# Patient Record
Sex: Male | Born: 1975 | Race: Black or African American | Hispanic: No | Marital: Married | Smoking: Never smoker
Health system: Southern US, Community
[De-identification: ages and names within clinical notes are randomized; demographics above are authoritative.]

## PROBLEM LIST (undated history)

## (undated) DIAGNOSIS — K5792 Diverticulitis of intestine, part unspecified, without perforation or abscess without bleeding: Secondary | ICD-10-CM

## (undated) HISTORY — PX: FEMUR SURGERY: SHX943

## (undated) HISTORY — PX: HIP SURGERY: SHX245

## (undated) HISTORY — PX: KNEE SURGERY: SHX244

---

## 2001-08-22 ENCOUNTER — Emergency Department (HOSPITAL_COMMUNITY): Admission: EM | Admit: 2001-08-22 | Discharge: 2001-08-22 | Payer: Self-pay | Admitting: Emergency Medicine

## 2001-08-22 ENCOUNTER — Encounter: Payer: Self-pay | Admitting: Emergency Medicine

## 2004-05-26 ENCOUNTER — Encounter (INDEPENDENT_AMBULATORY_CARE_PROVIDER_SITE_OTHER): Payer: Self-pay | Admitting: *Deleted

## 2004-05-26 ENCOUNTER — Observation Stay (HOSPITAL_COMMUNITY): Admission: EM | Admit: 2004-05-26 | Discharge: 2004-05-27 | Payer: Self-pay | Admitting: Family Medicine

## 2004-10-28 ENCOUNTER — Emergency Department (HOSPITAL_COMMUNITY): Admission: EM | Admit: 2004-10-28 | Discharge: 2004-10-29 | Payer: Self-pay | Admitting: Emergency Medicine

## 2004-11-05 ENCOUNTER — Encounter: Admission: RE | Admit: 2004-11-05 | Discharge: 2004-11-05 | Payer: Self-pay | Admitting: Otolaryngology

## 2005-08-18 ENCOUNTER — Emergency Department (HOSPITAL_COMMUNITY): Admission: EM | Admit: 2005-08-18 | Discharge: 2005-08-18 | Payer: Self-pay | Admitting: Emergency Medicine

## 2005-08-25 ENCOUNTER — Emergency Department (HOSPITAL_COMMUNITY): Admission: EM | Admit: 2005-08-25 | Discharge: 2005-08-26 | Payer: Self-pay | Admitting: Emergency Medicine

## 2006-07-20 ENCOUNTER — Emergency Department (HOSPITAL_COMMUNITY): Admission: EM | Admit: 2006-07-20 | Discharge: 2006-07-20 | Payer: Self-pay | Admitting: Emergency Medicine

## 2006-07-23 ENCOUNTER — Emergency Department (HOSPITAL_COMMUNITY): Admission: EM | Admit: 2006-07-23 | Discharge: 2006-07-23 | Payer: Self-pay | Admitting: Family Medicine

## 2009-01-12 ENCOUNTER — Ambulatory Visit: Payer: Self-pay | Admitting: Pulmonary Disease

## 2009-01-12 DIAGNOSIS — F518 Other sleep disorders not due to a substance or known physiological condition: Secondary | ICD-10-CM | POA: Insufficient documentation

## 2009-01-12 DIAGNOSIS — G4733 Obstructive sleep apnea (adult) (pediatric): Secondary | ICD-10-CM | POA: Insufficient documentation

## 2009-02-09 ENCOUNTER — Ambulatory Visit (HOSPITAL_BASED_OUTPATIENT_CLINIC_OR_DEPARTMENT_OTHER): Admission: RE | Admit: 2009-02-09 | Discharge: 2009-02-09 | Payer: Self-pay | Admitting: Pulmonary Disease

## 2009-02-09 ENCOUNTER — Encounter: Payer: Self-pay | Admitting: Pulmonary Disease

## 2009-02-11 ENCOUNTER — Ambulatory Visit: Payer: Self-pay | Admitting: Pulmonary Disease

## 2009-02-14 ENCOUNTER — Telehealth (INDEPENDENT_AMBULATORY_CARE_PROVIDER_SITE_OTHER): Payer: Self-pay | Admitting: *Deleted

## 2009-02-23 ENCOUNTER — Ambulatory Visit: Payer: Self-pay | Admitting: Pulmonary Disease

## 2009-10-18 ENCOUNTER — Inpatient Hospital Stay (HOSPITAL_COMMUNITY): Admission: EM | Admit: 2009-10-18 | Discharge: 2009-11-02 | Payer: Self-pay | Admitting: Emergency Medicine

## 2009-10-19 DIAGNOSIS — Z9189 Other specified personal risk factors, not elsewhere classified: Secondary | ICD-10-CM | POA: Insufficient documentation

## 2009-10-19 DIAGNOSIS — D696 Thrombocytopenia, unspecified: Secondary | ICD-10-CM

## 2009-10-19 DIAGNOSIS — S27329A Contusion of lung, unspecified, initial encounter: Secondary | ICD-10-CM | POA: Insufficient documentation

## 2009-10-19 DIAGNOSIS — D62 Acute posthemorrhagic anemia: Secondary | ICD-10-CM

## 2009-10-19 DIAGNOSIS — S72309B Unspecified fracture of shaft of unspecified femur, initial encounter for open fracture type I or II: Secondary | ICD-10-CM

## 2009-10-19 DIAGNOSIS — J9600 Acute respiratory failure, unspecified whether with hypoxia or hypercapnia: Secondary | ICD-10-CM

## 2009-10-19 DIAGNOSIS — S36115A Moderate laceration of liver, initial encounter: Secondary | ICD-10-CM | POA: Insufficient documentation

## 2009-11-20 ENCOUNTER — Inpatient Hospital Stay (HOSPITAL_COMMUNITY): Admission: AD | Admit: 2009-11-20 | Discharge: 2009-11-24 | Payer: Self-pay | Admitting: Orthopedic Surgery

## 2009-11-20 ENCOUNTER — Encounter: Payer: Self-pay | Admitting: Internal Medicine

## 2009-11-20 DIAGNOSIS — T8140XA Infection following a procedure, unspecified, initial encounter: Secondary | ICD-10-CM

## 2009-11-22 ENCOUNTER — Ambulatory Visit: Payer: Self-pay | Admitting: Internal Medicine

## 2009-11-26 ENCOUNTER — Encounter: Payer: Self-pay | Admitting: Internal Medicine

## 2009-11-30 ENCOUNTER — Encounter (INDEPENDENT_AMBULATORY_CARE_PROVIDER_SITE_OTHER): Payer: Self-pay | Admitting: *Deleted

## 2009-12-03 ENCOUNTER — Encounter: Payer: Self-pay | Admitting: Internal Medicine

## 2009-12-06 ENCOUNTER — Ambulatory Visit: Payer: Self-pay | Admitting: Internal Medicine

## 2009-12-07 ENCOUNTER — Encounter: Payer: Self-pay | Admitting: Internal Medicine

## 2009-12-19 ENCOUNTER — Encounter: Payer: Self-pay | Admitting: Internal Medicine

## 2009-12-20 ENCOUNTER — Ambulatory Visit: Payer: Self-pay | Admitting: Internal Medicine

## 2009-12-28 ENCOUNTER — Encounter: Payer: Self-pay | Admitting: Internal Medicine

## 2010-01-02 ENCOUNTER — Encounter: Payer: Self-pay | Admitting: Internal Medicine

## 2010-01-08 ENCOUNTER — Encounter
Admission: RE | Admit: 2010-01-08 | Discharge: 2010-03-12 | Payer: Self-pay | Source: Home / Self Care | Admitting: Orthopedic Surgery

## 2010-04-26 ENCOUNTER — Ambulatory Visit (HOSPITAL_COMMUNITY)
Admission: RE | Admit: 2010-04-26 | Discharge: 2010-04-26 | Payer: Self-pay | Source: Home / Self Care | Attending: Orthopedic Surgery | Admitting: Orthopedic Surgery

## 2010-04-29 LAB — BASIC METABOLIC PANEL
BUN: 13 mg/dL (ref 6–23)
GFR calc Af Amer: >60 mL/min (ref >60)
GFR calc non Af Amer: >60 mL/min (ref >60)
Glucose, Bld: 79 mg/dL (ref 70–99)
Potassium: 5 mEq/L (ref 3.5–5.1)

## 2010-04-29 LAB — CBC
HCT: 42 % (ref 39.0–52.0)
MCV: 91.7 fL (ref 78.0–100.0)
Platelets: 230 10*3/uL (ref 150–400)
RBC: 4.58 MIL/uL (ref 4.22–5.81)
RDW: 14.8 % (ref 11.5–15.5)

## 2010-04-29 LAB — DIFFERENTIAL
Basophils Absolute: 0 10*3/uL (ref 0.0–0.1)
Basophils Relative: 1 % (ref 0–1)
Lymphs Abs: 2.9 10*3/uL (ref 0.7–4.0)
Neutro Abs: 1.7 10*3/uL (ref 1.7–7.7)

## 2010-04-29 LAB — TYPE AND SCREEN
ABO/RH(D): AB POS
Antibody Screen: NEGATIVE

## 2010-04-29 LAB — PROTIME-INR
INR: 1.01 (ref 0.00–1.49)
Prothrombin Time: 13.5 seconds (ref 11.6–15.2)

## 2010-04-29 LAB — APTT: aPTT: 29 seconds (ref 24–37)

## 2010-04-29 LAB — SURGICAL PCR SCREEN
MRSA, PCR: NEGATIVE
Staphylococcus aureus: NEGATIVE

## 2010-04-29 LAB — C-REACTIVE PROTEIN: CRP: 0.3 mg/dL — ABNORMAL LOW (ref <0.6)

## 2010-04-29 LAB — SEDIMENTATION RATE: Sed Rate: 2 mm/hr (ref 0–16)

## 2010-05-07 NOTE — Consult Note (Signed)
Summary: [facility] Tra[facility] Specialist  Ortho. Trauma Specialist   Imported By: Florinda Marker 01/23/2010 09:01:12  _____________________________________________________________________  External Attachment:    Type:   Image     Comment:   External Document

## 2010-05-07 NOTE — Miscellaneous (Signed)
Summary: HIPAA Restrictions  HIPAA Restrictions   Imported By: Florinda Marker 12/07/2009 15:31:48  _____________________________________________________________________  External Attachment:    Type:   Image     Comment:   External Document

## 2010-05-07 NOTE — Assessment & Plan Note (Signed)
Summary: 2wk f/u - difficulty falling and staying asleep   Referring Provider:  Self Referral Primary Provider:  None  CC:  follow-up visit, difficulty falling asleep and staying asleep, and mind won't let him go to sleep.[name]  History of Present Illness: Mr. Lukins has now completed 30 days of antibiotic therapy for his MRSA abdominal wound infection.  All of the stitches are out and he's had no wound drainage or residual pain.  He's had no problems tolerating his doxycycline.  He is not sleeping well but he feels rested during the day and is now back working out in the gym.  Preventive Screening-Counseling & Management  Alcohol-Tobacco     Alcohol drinks/day: 0     Smoking Status: never  Caffeine-Diet-Exercise     Caffeine use/day: no     Does Patient Exercise: yes, not right now     Type of exercise: football  Safety-Violence-Falls     Seat Belt Use: no   Current Allergies (reviewed today): No known allergies  Vital Signs:  Patient profile:   35 year old male Height:      72 inches (182.88 cm) Weight:      211.8 pounds (96.27 kg) BMI:     28.83 Temp:     98.4 degrees F (36.89 degrees C) oral Pulse rate:   81 / minute BP sitting:   123 / 85  (left arm) Cuff size:   large  Vitals Entered By: Jennet Maduro RN (December 20, 2009 2:57 PM) CC: follow-up visit, difficulty falling asleep and staying asleep, mind won't let him go to sleep. Is Patient Diabetic? No Pain Assessment Patient in pain? no      Nutritional Status BMI of 25 - 29 = overweight Nutritional Status Detail appetite "getting back to his regular appetite"  Have you ever been in a relationship where you felt threatened, hurt or afraid?not asked    Does patient need assistance? Functional Status Self care Ambulation Impaired:Risk for fall Comments using one crutch for stability   Physical Exam  General:  alert and well-nourished.   Abdomen:  is low transverse incision is healing nicely without any  signs of active infection. Extremities:  his previous right arm PICC site is well healed.   Impression & Recommendations:  Problem # 1:  OTHER POSTOPERATIVE INFECTION NEC (ICD-998.59) I suspect his MRSA infection is cured.  He will stop the doxycycline today.  He knows to call me if he has any signs or symptoms of recurrent infection. Orders: Est. Patient Level III (16109)  Patient Instructions: 1)  Please schedule a follow-up appointment as needed.
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
## 2010-05-07 NOTE — Consult Note (Signed)
Summary: [facility]   Imported By: Florinda Marker 12/31/2009 15:56:37  _____________________________________________________________________  External Attachment:    Type:   Image     Comment:   External Document

## 2010-05-07 NOTE — Miscellaneous (Signed)
Summary: [employer]  [employer]   Imported By: [name] 12/31/2009 11:07:12  _____________________________________________________________________  External Attachment:    Type:   Image     Comment:   External Document
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
## 2010-06-12 ENCOUNTER — Encounter: Payer: Self-pay | Admitting: *Deleted

## 2010-06-21 LAB — HEPATIC FUNCTION PANEL
ALT: 15 U/L (ref 0–53)
AST: 14 U/L (ref 0–37)
AST: 21 U/L (ref 0–37)
Alkaline Phosphatase: 95 U/L (ref 39–117)
Bilirubin, Direct: 0.2 mg/dL (ref 0.0–0.3)
Bilirubin, Direct: 0.2 mg/dL (ref 0.0–0.3)
Indirect Bilirubin: 0.3 mg/dL (ref 0.3–0.9)
Total Bilirubin: 0.9 mg/dL (ref 0.3–1.2)

## 2010-06-21 LAB — CBC
HCT: 30.7 % — ABNORMAL LOW (ref 39.0–52.0)
HCT: 34.2 % — ABNORMAL LOW (ref 39.0–52.0)
Hemoglobin: 10.1 g/dL — ABNORMAL LOW (ref 13.0–17.0)
MCH: 29.9 pg (ref 26.0–34.0)
MCH: 30.4 pg (ref 26.0–34.0)
MCHC: 33 g/dL (ref 30.0–36.0)
MCHC: 33 g/dL (ref 30.0–36.0)
MCHC: 33.6 g/dL (ref 30.0–36.0)
MCV: 90.5 fL (ref 78.0–100.0)
MCV: 90.6 fL (ref 78.0–100.0)
Platelets: 322 10*3/uL (ref 150–400)
Platelets: 356 10*3/uL (ref 150–400)
RBC: 3.39 MIL/uL — ABNORMAL LOW (ref 4.22–5.81)
RDW: 14.9 % (ref 11.5–15.5)
RDW: 15.2 % (ref 11.5–15.5)
WBC: 14 10*3/uL — ABNORMAL HIGH (ref 4.0–10.5)
WBC: 5.6 10*3/uL (ref 4.0–10.5)
WBC: 8.9 10*3/uL (ref 4.0–10.5)

## 2010-06-21 LAB — DIFFERENTIAL
Lymphocytes Relative: 19 % (ref 12–46)
Neutrophils Relative %: 69 % (ref 43–77)

## 2010-06-21 LAB — COMPREHENSIVE METABOLIC PANEL
AST: 19 U/L (ref 0–37)
BUN: 11 mg/dL (ref 6–23)
Creatinine, Ser: 1.05 mg/dL (ref 0.4–1.5)
GFR calc Af Amer: >60 mL/min (ref >60)
Glucose, Bld: 97 mg/dL (ref 70–99)
Potassium: 5 mEq/L (ref 3.5–5.1)
Total Protein: 8.1 g/dL (ref 6.0–8.3)

## 2010-06-21 LAB — ANAEROBIC CULTURE

## 2010-06-21 LAB — APTT MIXING STUDY SCREEN: aPTT Baseline: 31 seconds (ref 24–37)

## 2010-06-21 LAB — CULTURE, ROUTINE-ABSCESS

## 2010-06-21 LAB — BASIC METABOLIC PANEL
BUN: 3 mg/dL — ABNORMAL LOW (ref 6–23)
CO2: 30 mEq/L (ref 19–32)
Calcium: 9.2 mg/dL (ref 8.4–10.5)
Chloride: 102 mEq/L (ref 96–112)
Chloride: 99 mEq/L (ref 96–112)
Creatinine, Ser: 0.79 mg/dL (ref 0.4–1.5)
Creatinine, Ser: 1.11 mg/dL (ref 0.4–1.5)
GFR calc Af Amer: >60 mL/min (ref >60)
Glucose, Bld: 118 mg/dL — ABNORMAL HIGH (ref 70–99)
Potassium: 4.8 mEq/L (ref 3.5–5.1)

## 2010-06-21 LAB — TYPE AND SCREEN: ABO/RH(D): AB POS

## 2010-06-21 LAB — VANCOMYCIN, RANDOM: Vancomycin Rm: 14.6 ug/mL

## 2010-06-21 LAB — PROTIME-INR: Prothrombin Time: 15.2 seconds (ref 11.6–15.2)

## 2010-06-21 LAB — GRAM STAIN

## 2010-06-21 LAB — SEDIMENTATION RATE: Sed Rate: 60 mm/hr — ABNORMAL HIGH (ref 0–16)

## 2010-06-22 LAB — CATH TIP CULTURE: Culture: 3

## 2010-06-22 LAB — POCT I-STAT 3, ART BLOOD GAS (G3+)
Acid-Base Excess: 1 mmol/L (ref 0.0–2.0)
Acid-Base Excess: 1 mmol/L (ref 0.0–2.0)
Acid-Base Excess: 2 mmol/L (ref 0.0–2.0)
Acid-Base Excess: 2 mmol/L (ref 0.0–2.0)
Acid-Base Excess: 2 mmol/L (ref 0.0–2.0)
Acid-base deficit: 3 mmol/L — ABNORMAL HIGH (ref 0.0–2.0)
Bicarbonate: 25.7 mEq/L — ABNORMAL HIGH (ref 20.0–24.0)
Bicarbonate: 26.6 mEq/L — ABNORMAL HIGH (ref 20.0–24.0)
Bicarbonate: 26.7 mEq/L — ABNORMAL HIGH (ref 20.0–24.0)
Bicarbonate: 26.7 mEq/L — ABNORMAL HIGH (ref 20.0–24.0)
Bicarbonate: 27.1 mEq/L — ABNORMAL HIGH (ref 20.0–24.0)
Bicarbonate: 27.3 mEq/L — ABNORMAL HIGH (ref 20.0–24.0)
O2 Saturation: 100 %
O2 Saturation: 90 %
O2 Saturation: 91 %
O2 Saturation: 94 %
O2 Saturation: 94 %
O2 Saturation: 96 %
Patient temperature: 100
Patient temperature: 100.6
Patient temperature: 100.8
Patient temperature: 102.4
Patient temperature: 102.9
Patient temperature: 98.7
TCO2: 25 mmol/L (ref 0–100)
TCO2: 28 mmol/L (ref 0–100)
TCO2: 28 mmol/L (ref 0–100)
TCO2: 28 mmol/L (ref 0–100)
pCO2 arterial: 41 mmHg (ref 35.0–45.0)
pCO2 arterial: 42.3 mmHg (ref 35.0–45.0)
pCO2 arterial: 43.5 mmHg (ref 35.0–45.0)
pCO2 arterial: 45.6 mmHg — ABNORMAL HIGH (ref 35.0–45.0)
pCO2 arterial: 46.8 mmHg — ABNORMAL HIGH (ref 35.0–45.0)
pH, Arterial: 7.377 (ref 7.350–7.450)
pH, Arterial: 7.385 (ref 7.350–7.450)
pH, Arterial: 7.404 (ref 7.350–7.450)
pH, Arterial: 7.408 (ref 7.350–7.450)
pH, Arterial: 7.421 (ref 7.350–7.450)
pO2, Arterial: 109 mmHg — ABNORMAL HIGH (ref 80.0–100.0)
pO2, Arterial: 325 mmHg — ABNORMAL HIGH (ref 80.0–100.0)
pO2, Arterial: 53 mmHg — ABNORMAL LOW (ref 80.0–100.0)
pO2, Arterial: 66 mmHg — ABNORMAL LOW (ref 80.0–100.0)
pO2, Arterial: 67 mmHg — ABNORMAL LOW (ref 80.0–100.0)
pO2, Arterial: 72 mmHg — ABNORMAL LOW (ref 80.0–100.0)
pO2, Arterial: 80 mmHg (ref 80.0–100.0)
pO2, Arterial: 84 mmHg (ref 80.0–100.0)
pO2, Arterial: 96 mmHg (ref 80.0–100.0)

## 2010-06-22 LAB — CBC
HCT: 22.7 % — ABNORMAL LOW (ref 39.0–52.0)
HCT: 22.9 % — ABNORMAL LOW (ref 39.0–52.0)
HCT: 23.4 % — ABNORMAL LOW (ref 39.0–52.0)
HCT: 23.7 % — ABNORMAL LOW (ref 39.0–52.0)
HCT: 24.1 % — ABNORMAL LOW (ref 39.0–52.0)
HCT: 25.3 % — ABNORMAL LOW (ref 39.0–52.0)
HCT: 29.5 % — ABNORMAL LOW (ref 39.0–52.0)
HCT: 29.9 % — ABNORMAL LOW (ref 39.0–52.0)
HCT: 31.8 % — ABNORMAL LOW (ref 39.0–52.0)
Hemoglobin: 10.7 g/dL — ABNORMAL LOW (ref 13.0–17.0)
Hemoglobin: 7.8 g/dL — ABNORMAL LOW (ref 13.0–17.0)
Hemoglobin: 7.8 g/dL — ABNORMAL LOW (ref 13.0–17.0)
Hemoglobin: 7.9 g/dL — ABNORMAL LOW (ref 13.0–17.0)
Hemoglobin: 7.9 g/dL — ABNORMAL LOW (ref 13.0–17.0)
Hemoglobin: 8.1 g/dL — ABNORMAL LOW (ref 13.0–17.0)
Hemoglobin: 8.2 g/dL — ABNORMAL LOW (ref 13.0–17.0)
Hemoglobin: 8.6 g/dL — ABNORMAL LOW (ref 13.0–17.0)
MCH: 30.3 pg (ref 26.0–34.0)
MCH: 30.5 pg (ref 26.0–34.0)
MCH: 30.8 pg (ref 26.0–34.0)
MCH: 31 pg (ref 26.0–34.0)
MCH: 31 pg (ref 26.0–34.0)
MCH: 31.1 pg (ref 26.0–34.0)
MCH: 31.1 pg (ref 26.0–34.0)
MCH: 31.2 pg (ref 26.0–34.0)
MCH: 31.2 pg (ref 26.0–34.0)
MCH: 31.4 pg (ref 26.0–34.0)
MCH: 31.5 pg (ref 26.0–34.0)
MCHC: 33.2 g/dL (ref 30.0–36.0)
MCHC: 33.6 g/dL (ref 30.0–36.0)
MCHC: 33.8 g/dL (ref 30.0–36.0)
MCHC: 33.9 g/dL (ref 30.0–36.0)
MCHC: 34.1 g/dL (ref 30.0–36.0)
MCHC: 34.1 g/dL (ref 30.0–36.0)
MCHC: 34.1 g/dL (ref 30.0–36.0)
MCHC: 34.3 g/dL (ref 30.0–36.0)
MCHC: 34.5 g/dL (ref 30.0–36.0)
MCV: 90.5 fL (ref 78.0–100.0)
MCV: 90.7 fL (ref 78.0–100.0)
MCV: 90.8 fL (ref 78.0–100.0)
MCV: 90.8 fL (ref 78.0–100.0)
MCV: 91.2 fL (ref 78.0–100.0)
MCV: 91.6 fL (ref 78.0–100.0)
Platelets: 122 10*3/uL — ABNORMAL LOW (ref 150–400)
Platelets: 178 10*3/uL (ref 150–400)
Platelets: 282 10*3/uL (ref 150–400)
Platelets: 300 10*3/uL (ref 150–400)
Platelets: 354 10*3/uL (ref 150–400)
Platelets: 55 10*3/uL — ABNORMAL LOW (ref 150–400)
Platelets: 57 10*3/uL — ABNORMAL LOW (ref 150–400)
Platelets: 61 10*3/uL — ABNORMAL LOW (ref 150–400)
Platelets: 61 10*3/uL — ABNORMAL LOW (ref 150–400)
Platelets: 633 10*3/uL — ABNORMAL HIGH (ref 150–400)
Platelets: 65 10*3/uL — ABNORMAL LOW (ref 150–400)
Platelets: 68 10*3/uL — ABNORMAL LOW (ref 150–400)
Platelets: 68 10*3/uL — ABNORMAL LOW (ref 150–400)
Platelets: 77 10*3/uL — ABNORMAL LOW (ref 150–400)
RBC: 2.49 MIL/uL — ABNORMAL LOW (ref 4.22–5.81)
RBC: 2.53 MIL/uL — ABNORMAL LOW (ref 4.22–5.81)
RBC: 2.56 MIL/uL — ABNORMAL LOW (ref 4.22–5.81)
RBC: 2.61 MIL/uL — ABNORMAL LOW (ref 4.22–5.81)
RBC: 2.62 MIL/uL — ABNORMAL LOW (ref 4.22–5.81)
RBC: 2.64 MIL/uL — ABNORMAL LOW (ref 4.22–5.81)
RBC: 2.65 MIL/uL — ABNORMAL LOW (ref 4.22–5.81)
RBC: 2.75 MIL/uL — ABNORMAL LOW (ref 4.22–5.81)
RBC: 2.94 MIL/uL — ABNORMAL LOW (ref 4.22–5.81)
RBC: 2.98 MIL/uL — ABNORMAL LOW (ref 4.22–5.81)
RBC: 3.3 MIL/uL — ABNORMAL LOW (ref 4.22–5.81)
RBC: 3.5 MIL/uL — ABNORMAL LOW (ref 4.22–5.81)
RDW: 14.3 % (ref 11.5–15.5)
RDW: 14.4 % (ref 11.5–15.5)
RDW: 14.9 % (ref 11.5–15.5)
RDW: 14.9 % (ref 11.5–15.5)
RDW: 15.1 % (ref 11.5–15.5)
RDW: 15.4 % (ref 11.5–15.5)
RDW: 15.5 % (ref 11.5–15.5)
RDW: 15.5 % (ref 11.5–15.5)
RDW: 15.7 % — ABNORMAL HIGH (ref 11.5–15.5)
RDW: 15.9 % — ABNORMAL HIGH (ref 11.5–15.5)
RDW: 15.9 % — ABNORMAL HIGH (ref 11.5–15.5)
RDW: 16.1 % — ABNORMAL HIGH (ref 11.5–15.5)
WBC: 11.9 10*3/uL — ABNORMAL HIGH (ref 4.0–10.5)
WBC: 14.9 10*3/uL — ABNORMAL HIGH (ref 4.0–10.5)
WBC: 16.1 10*3/uL — ABNORMAL HIGH (ref 4.0–10.5)
WBC: 16.7 10*3/uL — ABNORMAL HIGH (ref 4.0–10.5)
WBC: 17.7 10*3/uL — ABNORMAL HIGH (ref 4.0–10.5)
WBC: 6.8 10*3/uL (ref 4.0–10.5)
WBC: 7.2 10*3/uL (ref 4.0–10.5)
WBC: 7.3 10*3/uL (ref 4.0–10.5)
WBC: 7.4 10*3/uL (ref 4.0–10.5)
WBC: 7.8 10*3/uL (ref 4.0–10.5)
WBC: 8.2 10*3/uL (ref 4.0–10.5)

## 2010-06-22 LAB — POCT I-STAT 7, (LYTES, BLD GAS, ICA,H+H)
Bicarbonate: 28.8 mEq/L — ABNORMAL HIGH (ref 20.0–24.0)
Calcium, Ion: 0.94 mmol/L — ABNORMAL LOW (ref 1.12–1.32)
HCT: 22 % — ABNORMAL LOW (ref 39.0–52.0)
HCT: 27 % — ABNORMAL LOW (ref 39.0–52.0)
Hemoglobin: 7.5 g/dL — ABNORMAL LOW (ref 13.0–17.0)
Hemoglobin: 9.2 g/dL — ABNORMAL LOW (ref 13.0–17.0)
O2 Saturation: 100 %
Potassium: 3.9 mEq/L (ref 3.5–5.1)
Sodium: 141 mEq/L (ref 135–145)
pCO2 arterial: 48.3 mmHg — ABNORMAL HIGH (ref 35.0–45.0)
pCO2 arterial: 49.5 mmHg — ABNORMAL HIGH (ref 35.0–45.0)
pH, Arterial: 7.273 — ABNORMAL LOW (ref 7.350–7.450)
pH, Arterial: 7.383 (ref 7.350–7.450)
pO2, Arterial: 463 mmHg — ABNORMAL HIGH (ref 80.0–100.0)

## 2010-06-22 LAB — BASIC METABOLIC PANEL
BUN: 17 mg/dL (ref 6–23)
BUN: 22 mg/dL (ref 6–23)
BUN: 7 mg/dL (ref 6–23)
BUN: 9 mg/dL (ref 6–23)
BUN: 9 mg/dL (ref 6–23)
CO2: 27 mEq/L (ref 19–32)
CO2: 29 mEq/L (ref 19–32)
CO2: 29 mEq/L (ref 19–32)
Calcium: 8 mg/dL — ABNORMAL LOW (ref 8.4–10.5)
Calcium: 8.3 mg/dL — ABNORMAL LOW (ref 8.4–10.5)
Calcium: 8.3 mg/dL — ABNORMAL LOW (ref 8.4–10.5)
Calcium: 8.3 mg/dL — ABNORMAL LOW (ref 8.4–10.5)
Calcium: 8.4 mg/dL (ref 8.4–10.5)
Chloride: 101 mEq/L (ref 96–112)
Chloride: 103 mEq/L (ref 96–112)
Chloride: 105 mEq/L (ref 96–112)
Chloride: 108 mEq/L (ref 96–112)
Creatinine, Ser: 0.95 mg/dL (ref 0.4–1.5)
Creatinine, Ser: 0.97 mg/dL (ref 0.4–1.5)
Creatinine, Ser: 1.05 mg/dL (ref 0.4–1.5)
Creatinine, Ser: 1.11 mg/dL (ref 0.4–1.5)
Creatinine, Ser: 1.2 mg/dL (ref 0.4–1.5)
Creatinine, Ser: 1.27 mg/dL (ref 0.4–1.5)
GFR calc Af Amer: >60 mL/min (ref >60)
GFR calc Af Amer: >60 mL/min (ref >60)
GFR calc Af Amer: >60 mL/min (ref >60)
GFR calc Af Amer: >60 mL/min (ref >60)
GFR calc Af Amer: >60 mL/min (ref >60)
GFR calc Af Amer: >60 mL/min (ref >60)
GFR calc Af Amer: >60 mL/min (ref >60)
GFR calc non Af Amer: >60 mL/min (ref >60)
GFR calc non Af Amer: >60 mL/min (ref >60)
GFR calc non Af Amer: >60 mL/min (ref >60)
GFR calc non Af Amer: >60 mL/min (ref >60)
GFR calc non Af Amer: >60 mL/min (ref >60)
GFR calc non Af Amer: >60 mL/min (ref >60)
Glucose, Bld: 122 mg/dL — ABNORMAL HIGH (ref 70–99)
Glucose, Bld: 124 mg/dL — ABNORMAL HIGH (ref 70–99)
Glucose, Bld: 170 mg/dL — ABNORMAL HIGH (ref 70–99)
Potassium: 3.7 mEq/L (ref 3.5–5.1)
Potassium: 3.8 mEq/L (ref 3.5–5.1)
Potassium: 3.8 mEq/L (ref 3.5–5.1)
Potassium: 4.2 mEq/L (ref 3.5–5.1)
Potassium: 4.6 mEq/L (ref 3.5–5.1)
Sodium: 132 mEq/L — ABNORMAL LOW (ref 135–145)
Sodium: 133 mEq/L — ABNORMAL LOW (ref 135–145)
Sodium: 138 mEq/L (ref 135–145)
Sodium: 139 mEq/L (ref 135–145)
Sodium: 141 mEq/L (ref 135–145)

## 2010-06-22 LAB — CULTURE, BAL-QUANTITATIVE W GRAM STAIN: Colony Count: >=100000

## 2010-06-22 LAB — DIFFERENTIAL
Basophils Absolute: 0 10*3/uL (ref 0.0–0.1)
Basophils Absolute: 0 10*3/uL (ref 0.0–0.1)
Basophils Relative: 0 % (ref 0–1)
Basophils Relative: 0 % (ref 0–1)
Basophils Relative: 0 % (ref 0–1)
Eosinophils Absolute: 0.3 10*3/uL (ref 0.0–0.7)
Eosinophils Absolute: 0.6 10*3/uL (ref 0.0–0.7)
Eosinophils Absolute: 0.7 10*3/uL (ref 0.0–0.7)
Eosinophils Relative: 4 % (ref 0–5)
Lymphocytes Relative: 10 % — ABNORMAL LOW (ref 12–46)
Lymphs Abs: 1.6 10*3/uL (ref 0.7–4.0)
Monocytes Absolute: 1.2 10*3/uL — ABNORMAL HIGH (ref 0.1–1.0)
Monocytes Absolute: 2.1 10*3/uL — ABNORMAL HIGH (ref 0.1–1.0)
Monocytes Relative: 11 % (ref 3–12)
Monocytes Relative: 11 % (ref 3–12)
Neutro Abs: 10.8 10*3/uL — ABNORMAL HIGH (ref 1.7–7.7)
Neutro Abs: 14.4 10*3/uL — ABNORMAL HIGH (ref 1.7–7.7)
Neutro Abs: 9.7 10*3/uL — ABNORMAL HIGH (ref 1.7–7.7)
Neutrophils Relative %: 72 % (ref 43–77)
Neutrophils Relative %: 72 % (ref 43–77)
Neutrophils Relative %: 81 % — ABNORMAL HIGH (ref 43–77)

## 2010-06-22 LAB — CULTURE, BLOOD (ROUTINE X 2)

## 2010-06-22 LAB — PROTIME-INR
INR: 1.35 (ref 0.00–1.49)
INR: 1.39 (ref 0.00–1.49)
Prothrombin Time: 15.5 seconds — ABNORMAL HIGH (ref 11.6–15.2)
Prothrombin Time: 16.9 seconds — ABNORMAL HIGH (ref 11.6–15.2)

## 2010-06-22 LAB — APTT: aPTT: 32 seconds (ref 24–37)

## 2010-06-22 LAB — URINE CULTURE

## 2010-06-22 LAB — COMPREHENSIVE METABOLIC PANEL
ALT: 85 U/L — ABNORMAL HIGH (ref 0–53)
Albumin: 2.2 g/dL — ABNORMAL LOW (ref 3.5–5.2)
Alkaline Phosphatase: 54 U/L (ref 39–117)
GFR calc Af Amer: >60 mL/min (ref >60)
Potassium: 4.7 mEq/L (ref 3.5–5.1)
Sodium: 138 mEq/L (ref 135–145)
Total Protein: 5.6 g/dL — ABNORMAL LOW (ref 6.0–8.3)

## 2010-06-22 LAB — URINALYSIS, ROUTINE W REFLEX MICROSCOPIC
Ketones, ur: 15 mg/dL — AB
Ketones, ur: NEGATIVE mg/dL
Leukocytes, UA: NEGATIVE
Nitrite: NEGATIVE
Nitrite: NEGATIVE
Protein, ur: 30 mg/dL — AB
Protein, ur: 30 mg/dL — AB
Urobilinogen, UA: 1 mg/dL (ref 0.0–1.0)

## 2010-06-22 LAB — VANCOMYCIN, TROUGH: Vancomycin Tr: 9.8 ug/mL — ABNORMAL LOW (ref 10.0–20.0)

## 2010-06-22 LAB — AMYLASE: Amylase: 78 U/L (ref 0–105)

## 2010-06-22 LAB — TRIGLYCERIDES
Triglycerides: 174 mg/dL — ABNORMAL HIGH (ref <150)
Triglycerides: 433 mg/dL — ABNORMAL HIGH (ref <150)
Triglycerides: 440 mg/dL — ABNORMAL HIGH (ref <150)

## 2010-06-22 LAB — MRSA PCR SCREENING: MRSA by PCR: NEGATIVE

## 2010-06-22 LAB — PHOSPHORUS: Phosphorus: 2.9 mg/dL (ref 2.3–4.6)

## 2010-06-23 LAB — TYPE AND SCREEN: PT AG Type: NEGATIVE

## 2010-06-23 LAB — PREPARE FRESH FROZEN PLASMA

## 2010-06-23 LAB — ABO/RH: ABO/RH(D): AB POS

## 2010-06-23 LAB — CBC
Hemoglobin: 11.3 g/dL — ABNORMAL LOW (ref 13.0–17.0)
MCHC: 33.1 g/dL (ref 30.0–36.0)
RDW: 14.3 % (ref 11.5–15.5)

## 2010-06-23 LAB — COMPREHENSIVE METABOLIC PANEL
Albumin: 2.6 g/dL — ABNORMAL LOW (ref 3.5–5.2)
Alkaline Phosphatase: 57 U/L (ref 39–117)
BUN: 10 mg/dL (ref 6–23)
CO2: 27 mEq/L (ref 19–32)
Chloride: 111 mEq/L (ref 96–112)
Creatinine, Ser: 1.46 mg/dL (ref 0.4–1.5)
GFR calc Af Amer: >60 mL/min (ref >60)
GFR calc non Af Amer: 56 mL/min — ABNORMAL LOW (ref >60)
Glucose, Bld: 143 mg/dL — ABNORMAL HIGH (ref 70–99)
Potassium: 3.5 mEq/L (ref 3.5–5.1)
Total Bilirubin: 0.6 mg/dL (ref 0.3–1.2)

## 2010-06-23 LAB — CROSSMATCH: ABO/RH(D): AB POS

## 2010-06-23 LAB — PROTIME-INR
INR: 1.3 (ref 0.00–1.49)
Prothrombin Time: 16.1 seconds — ABNORMAL HIGH (ref 11.6–15.2)

## 2010-06-23 LAB — APTT: aPTT: 37 seconds (ref 24–37)

## 2010-08-23 NOTE — H&P (Signed)
NAMEDYER, KLUG NO.:  [id number]   MEDICAL RECORD NO.:  [id number]          PATIENT TYPE:  EMS   LOCATION:  MAJO                         FACILITY:  [facility]   PHYSICIAN:  Ollen Gross. Vernell Morgans, M.D. DATE OF BIRTH:  [birth year]   DATE OF ADMISSION:  05/26/2004  DATE OF DISCHARGE:                                HISTORY & PHYSICAL   [name] is a 35 year old black male who has been on antibiotics recently  for a sinus infection. He presented last night to the emergency department  with right lower quadrant pain that started yesterday. He has not run any  fevers. He has had no nausea or vomiting, no chest pain or shortness of  breath, diarrhea or dysuria.   PAST MEDICAL HISTORY:  None.   PAST SURGICAL HISTORY:  None.   MEDICATIONS:  Amoxicillin.   ALLERGIES:  No known drug allergies.   SOCIAL HISTORY:  He denies use of alcohol or tobacco products.   FAMILY HISTORY:  Noncontributory.   PHYSICAL EXAMINATION:  VITAL SIGNS:  His temperature is 98.1, blood pressure  137/89, pulse is 72.  GENERAL:  He is a well-developed, well-nourished black male in no acute  distress.  SKIN:   Is warm and dry with no jaundice.  EYES:  His extraocular muscles are intact. Pupils are equal, round and  reactive to light. Sclerae are anicteric.  LUNGS:  Are clear bilaterally, no use of accessory respiratory muscles.  HEART:  Has a regular rate and rhythm with PMI in the left chest.  ABDOMEN:  Soft with some mild to moderate right lower quadrant tenderness  but no guarding or peritoneal signs. No palpable mass or hepatosplenomegaly.  EXTREMITIES:  No clubbing, cyanosis or edema. Good strength in his arms and  legs.  PSYCHOLOGIC:  Alert and oriented x3 with no evident state of anxiety or  depression.   REVIEW OF LAB WORK:  His white count was noted to be 6300.  His CT scan was  reviewed with the radiologist and was significant for an enlarged inflamed  appearing appendix.   ASSESSMENT/PLAN:  This is a 35 year old black male with acute appendicitis.  Because of the risk of perforation I have recommended he have appendectomy  today. I have explained to him in detail the risks and benefits of the  operation as well as some of the technical aspects and he understands and  wishes to proceed.  We will obtain some routine lab work in preparation for  doing this for him this afternoon.      PST/MEDQ  D:  05/26/2004  T:  05/26/2004  Job:  [id number]

## 2010-08-23 NOTE — Op Note (Signed)
NAMEKRISTJAN, [name] NO.:  [id number]   MEDICAL RECORD NO.:  [id number]          PATIENT TYPE:  INP   LOCATION:  1844                         FACILITY:  [facility]   PHYSICIAN:  Ollen Gross. Vernell Morgans, M.D. DATE OF BIRTH:  1976/01/06   DATE OF PROCEDURE:  05/26/2004  DATE OF DISCHARGE:                                 OPERATIVE REPORT   PREOPERATIVE DIAGNOSIS:  Appendicitis.   POSTOPERATIVE DIAGNOSIS:  Appendicitis.   PROCEDURE:  Laparoscopic appendectomy.   SURGEON:  Ollen Gross. Carolynne Edouard, M.D.   ANESTHESIA:  General endotracheal.   DESCRIPTION OF PROCEDURE:  After informed consent was obtained, the patient  was brought to the operating room and placed in the supine position on the  operating table.  After adequate induction of general anesthesia, the  patient's abdomen was prepped with Betadine and draped in the usual sterile  manner.  The area below the umbilicus was infiltrated with 0.25% Marcaine  and a small incision was made with a 15 blade knife.  This incision was  carried down through the subcutaneous tissue bluntly with a hemostat and  Army[employer] retractors until the linea alba was identified.  The linea alba  was incised with the 15 blade knife and each site was grasped with Kocher  clamps and elevated anteriorly.  The preperitoneal space was then probed  bluntly with a hemostat until the peritoneum was opened and access was  gained to the abdominal cavity.  A 0 Vicryl purse-string stitch was placed  in the fascia surrounding the opening.  A Hasson cannula was placed through  the opening and anchored in place with the previously placed Vicryl purse-  string stitch.  The abdomen was then insufflated with carbon dioxide without  difficulty.  The patient was placed in a Trendelenburg position and rotated  slightly with the right side up.  The laparoscope was inserted through the  Hasson cannula and the right lower quadrant was inspected.  A large inflamed  appendix was  able to be easily identified.  The suprapubic region was then  infiltrated with 0.25% Marcaine, a small incision was made with a 15 blade  knife and a 10-mm port was placed bluntly through this incision into the  abdominal cavity under direct vision.  Another site was chosen for a 5-mm  port just above the umbilicus and this area was infiltrated with 0.25%  Marcaine.  A small stab incision was made with a 15 blade knife and the 5-mm  port was placed bluntly through this incision into the abdominal cavity  under direct vision.  The laparoscope was then moved to the suprapubic port.  A grasping retractor was used to elevate the appendix and the mesoappendix  was then taken down sharply with the harmonic scalpel until the base of the  appendix at its junction with the cecum was readily identified and cleared  of any tissue.  A laparoscopic GIA 45-mm stapler with a blue load was then  placed through the Hasson cannula and across the base of the appendix at its  junction with the cecum and  clamped.  A minute was allowed to pass.  The  stapler was then fired, thereby dividing the base of the appendix at its  junction with the cecum between staple lines.  A laparoscopic bag was then  placed through the Hasson cannula and the appendix was placed within the bag  and the bag was sealed.  The staple line was inspected and it was found to  be hemostatic and healthy and intact.  The abdomen was then irrigated with  copious amounts of saline.  The appendix was then removed inside the bag  with the Hasson cannula through the infraumbilical port without difficulty.  The fascial defect was closed with the previously placed Vicryl purse-string  stitch as well as with another interrupted 0 Vicryl stitch.  The rest of the  ports were removed under direct vision and were found to be hemostatic.  Gas  was allowed to escape.  The skin incisions were all closed with interrupted  4-0 Monocryl subcuticular stitches.   Benzoin  and Steri-Strips were applied.  The patient tolerated the procedure well.  At the end of the case, all needle, sponge, and instrument counts were  correct.  The patient was then awakened and taken to the recovery room in  stable condition.      PST/MEDQ  D:  05/26/2004  T:  05/27/2004  Job:  [id number]

## 2010-09-09 ENCOUNTER — Encounter (INDEPENDENT_AMBULATORY_CARE_PROVIDER_SITE_OTHER): Payer: Self-pay | Admitting: General Surgery

## 2010-09-25 ENCOUNTER — Encounter (INDEPENDENT_AMBULATORY_CARE_PROVIDER_SITE_OTHER): Payer: Self-pay | Admitting: General Surgery

## 2011-03-17 ENCOUNTER — Ambulatory Visit (INDEPENDENT_AMBULATORY_CARE_PROVIDER_SITE_OTHER): Payer: Managed Care, Other (non HMO)

## 2011-03-17 DIAGNOSIS — R1084 Generalized abdominal pain: Secondary | ICD-10-CM

## 2011-03-17 DIAGNOSIS — N390 Urinary tract infection, site not specified: Secondary | ICD-10-CM

## 2011-03-17 DIAGNOSIS — R300 Dysuria: Secondary | ICD-10-CM

## 2011-08-21 ENCOUNTER — Ambulatory Visit (INDEPENDENT_AMBULATORY_CARE_PROVIDER_SITE_OTHER): Payer: Managed Care, Other (non HMO) | Admitting: Emergency Medicine

## 2011-08-21 ENCOUNTER — Ambulatory Visit: Payer: Managed Care, Other (non HMO)

## 2011-08-21 VITALS — BP 121/79 | HR 63 | Temp 98.0°F | Resp 16 | Ht 71.0 in | Wt 244.0 lb

## 2011-08-21 DIAGNOSIS — IMO0002 Reserved for concepts with insufficient information to code with codable children: Secondary | ICD-10-CM

## 2011-08-21 DIAGNOSIS — S1095XA Superficial foreign body of unspecified part of neck, initial encounter: Secondary | ICD-10-CM
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
## 2011-10-01 ENCOUNTER — Emergency Department (HOSPITAL_COMMUNITY)
Admission: EM | Admit: 2011-10-01 | Discharge: 2011-10-02 | Disposition: A | Discharge status: Home / Self Care | Payer: Managed Care, Other (non HMO) | Attending: Emergency Medicine | Admitting: Emergency Medicine

## 2011-10-01 ENCOUNTER — Encounter (HOSPITAL_COMMUNITY): Payer: Self-pay | Admitting: Physical Medicine and Rehabilitation

## 2011-10-01 ENCOUNTER — Ambulatory Visit (INDEPENDENT_AMBULATORY_CARE_PROVIDER_SITE_OTHER): Payer: Managed Care, Other (non HMO) | Admitting: Emergency Medicine

## 2011-10-01 VITALS — BP 105/69 | HR 77 | Temp 98.0°F | Resp 16 | Ht 70.0 in | Wt 245.8 lb

## 2011-10-01 DIAGNOSIS — S335XXA Sprain of ligaments of lumbar spine, initial encounter: Secondary | ICD-10-CM | POA: Insufficient documentation

## 2011-10-01 DIAGNOSIS — M795 Residual foreign body in soft tissue: Secondary | ICD-10-CM

## 2011-10-01 DIAGNOSIS — S39012A Strain of muscle, fascia and tendon of lower back, initial encounter: Secondary | ICD-10-CM

## 2011-10-01 DIAGNOSIS — X500XXA Overexertion from strenuous movement or load, initial encounter: Secondary | ICD-10-CM | POA: Insufficient documentation

## 2011-10-01 MED ORDER — NAPROXEN SODIUM 550 MG PO TABS: 550.0000 mg | ORAL_TABLET | Freq: Two times a day (BID) | ORAL | Status: DC

## 2011-10-01 MED ORDER — CYCLOBENZAPRINE HCL 10 MG PO TABS: 10.0000 mg | ORAL_TABLET | Freq: Three times a day (TID) | ORAL | Status: AC | PRN

## 2011-10-01 NOTE — ED Notes (Signed)
Pt presents to department for evaluation of middle back pain. Ongoing x1 day. States he was moving furniture last Saturday when he could of injured back. States he was seen at [facility] and prescribed PO medications, no relief from pain. 9/10 upon arrival. Ambulatory without difficulty. Denies urinary symptoms. He is alert and oriented x4.

## 2011-10-01 NOTE — Progress Notes (Signed)
  Subjective:    Patient ID: [name], male    DOB: 1975-06-11, 36 y.o.   MRN: [id number]  HPI Comments: Picked up a table and twisted while holding it over weekend and developed right mid back pain that is not radiating in back.  Not associated with neuro symptoms  Back Pain This is a new problem. The current episode started in the past 7 days. The problem occurs daily. The problem is unchanged. The pain is present in the lumbar spine. The quality of the pain is described as burning and cramping. The pain does not radiate. The pain is at a severity of 3/10. The pain is mild. The pain is worse during the day. The symptoms are aggravated by bending and twisting. Stiffness is present all day. Pertinent negatives include no abdominal pain, bladder incontinence, bowel incontinence, chest pain, dysuria, fever, headaches, leg pain, numbness, paresis, paresthesias, pelvic pain, perianal numbness, tingling, weakness or weight loss. Risk factors include sedentary lifestyle. He has tried analgesics for the symptoms. The treatment provided no relief.      Review of Systems  Constitutional: Negative.  Negative for fever and weight loss.  HENT: Negative.   Eyes: Negative.   Respiratory: Negative.   Cardiovascular: Negative for chest pain.  Gastrointestinal: Negative.  Negative for abdominal pain and bowel incontinence.  Genitourinary: Negative.  Negative for bladder incontinence, dysuria and pelvic pain.  Musculoskeletal: Positive for back pain.  Neurological: Negative.  Negative for tingling, weakness, numbness, headaches and paresthesias.       Objective:   Physical Exam  Constitutional: He is oriented to person, place, and time. He appears well-developed and well-nourished.  HENT:  Head: Normocephalic and atraumatic.  Eyes: Conjunctivae are normal. Pupils are equal, round, and reactive to light.  Neck: Normal range of motion. Neck supple.  Cardiovascular: Normal rate and regular rhythm.     Pulmonary/Chest: Effort normal and breath sounds normal.  Abdominal: Bowel sounds are normal. There is no tenderness.  Musculoskeletal: He exhibits tenderness.  Neurological: He is alert and oriented to person, place, and time. He exhibits normal muscle tone. Coordination normal.  Skin: Skin is warm and dry.          Assessment & Plan:  Mid right low back pain.  Meds, local heat Follow up with new or worsened symptoms or failure to improve in one week.

## 2011-10-02 MED ORDER — HYDROMORPHONE HCL PF 1 MG/ML IJ SOLN
1.0000 mg | Freq: Once | INTRAMUSCULAR | Status: AC
  Administered 2011-10-02: 1 mg via INTRAMUSCULAR
  Filled 2011-10-02: qty 1

## 2011-10-02 MED ORDER — DIAZEPAM 5 MG PO TABS: 5.0000 mg | ORAL_TABLET | Freq: Four times a day (QID) | ORAL | Status: AC | PRN

## 2011-10-02 MED ORDER — DIAZEPAM 5 MG/ML IJ SOLN
5.0000 mg | Freq: Once | INTRAMUSCULAR | Status: AC
  Administered 2011-10-02: 5 mg via INTRAMUSCULAR
  Filled 2011-10-02: qty 2

## 2011-10-02 MED ORDER — HYDROCODONE-ACETAMINOPHEN 5-325 MG PO TABS: 1.0000 | ORAL_TABLET | ORAL | Status: AC | PRN

## 2011-10-02 NOTE — ED Provider Notes (Signed)
Medical screening examination/treatment/procedure(s) were performed by non-physician practitioner and as supervising physician I was immediately available for consultation/collaboration.  Sunnie Nielsen, MD 10/02/11 [phone]

## 2011-10-02 NOTE — ED Provider Notes (Signed)
History     CSN: [id number]  Arrival date & time 10/01/11  2343   First MD Initiated Contact with Patient 10/02/11 0008      Chief Complaint  Patient presents with  . Back Pain    (Consider location/radiation/quality/duration/timing/severity/associated sxs/prior treatment) HPI Comments: Patient here with right paraspinal muscular back pain without radiation - states that last Saturday he was lifting furniture to help a friend move - states that intially he did not have any pain but this started yesterday, states that he went to [facility] and was given naproxyn and flexeril which has not helped his pain.  He denies weakness, numbness, tingling, loss of control of bowels or bladder or urinary retention - states pain worse with movement and certain positions, denies nausea, vomiting, fever, chills, hematuria  Patient is a 36 y.o. male presenting with back pain. The history is provided by the patient. No language interpreter was used.  Back Pain  This is a new problem. The current episode started yesterday. The problem occurs constantly. The problem has not changed since onset.The pain is associated with lifting heavy objects. The pain is present in the lumbar spine. The quality of the pain is described as stabbing. The pain does not radiate. The pain is at a severity of 9/10. The pain is severe. The symptoms are aggravated by bending, twisting and certain positions. The pain is the same all the time. Stiffness is present all day. Pertinent negatives include no chest pain, no fever, no numbness, no weight loss, no headaches, no abdominal pain, no abdominal swelling, no bowel incontinence, no perianal numbness, no bladder incontinence, no dysuria, no pelvic pain, no leg pain, no paresthesias, no paresis, no tingling and no weakness. He has tried NSAIDs and muscle relaxants for the symptoms. The treatment provided no relief.    No past medical history on file.  No past surgical history on  file.  History reviewed. No pertinent family history.  History  Substance Use Topics  . Smoking status: Never Smoker   . Smokeless tobacco: Not on file  . Alcohol Use: No      Review of Systems  Constitutional: Negative for fever and weight loss.  Cardiovascular: Negative for chest pain.  Gastrointestinal: Negative for abdominal pain and bowel incontinence.  Genitourinary: Negative for bladder incontinence, dysuria and pelvic pain.  Musculoskeletal: Positive for back pain.  Neurological: Negative for tingling, weakness, numbness, headaches and paresthesias.  All other systems reviewed and are negative.    Allergies  Review of patient's allergies indicates no known allergies.  Home Medications   Current Outpatient Rx  Name Route Sig Dispense Refill  . CYCLOBENZAPRINE HCL 10 MG PO TABS Oral Take 1 tablet (10 mg total) by mouth 3 (three) times daily as needed for muscle spasms. 30 tablet 0    BP 124/83  Pulse 77  Temp 98 F (36.7 C) (Oral)  Resp 18  SpO2 99%  Physical Exam  Nursing note and vitals reviewed. Constitutional: He is oriented to person, place, and time. He appears well-developed and well-nourished. No distress.  HENT:  Head: Normocephalic and atraumatic.  Right Ear: External ear normal.  Left Ear: External ear normal.  Nose: Nose normal.  Mouth/Throat: Oropharynx is clear and moist. No oropharyngeal exudate.  Eyes: Conjunctivae are normal. Pupils are equal, round, and reactive to light. No scleral icterus.  Neck: Normal range of motion. Neck supple.  Cardiovascular: Normal rate, regular rhythm and normal heart sounds.  Exam reveals no gallop and no  friction rub.   No murmur heard. Pulmonary/Chest: Effort normal and breath sounds normal. No respiratory distress. He has no wheezes. He has no rales. He exhibits no tenderness.  Abdominal: Soft. Bowel sounds are normal. He exhibits no distension and no mass. There is no tenderness. There is no rebound and no  guarding.  Musculoskeletal:       Thoracic back: He exhibits tenderness, pain and spasm. He exhibits normal range of motion, no bony tenderness and normal pulse.       Lumbar back: He exhibits tenderness, pain and spasm. He exhibits normal range of motion, no bony tenderness, no deformity and normal pulse.       Back:  Lymphadenopathy:    He has no cervical adenopathy.  Neurological: He is alert and oriented to person, place, and time. He has normal reflexes. No cranial nerve deficit. He exhibits normal muscle tone. Coordination normal.       Normal gait  Skin: Skin is warm and dry. No rash noted. No erythema. No pallor.  Psychiatric: He has a normal mood and affect. His behavior is normal. Judgment and thought content normal.    ED Course  Procedures (including critical care time)  Labs Reviewed - No data to display No results found.   Lumbar strain   MDM  Patient here with worsening lower back pain - was seen at [facility] yesterday and given medications - they are not helping, there are no alarming signs for cauda equina or epidural hematoma, reports moderate relief of pain with medications here - will discharge home.  Do not feel that imaging is necessary at this time.        Izola Price [place], [place] 10/02/11 0159

## 2011-10-02 NOTE — Discharge Instructions (Signed)
Back Pain, Adult Low back pain is very common. About 1 in 5 people have back pain.The cause of low back pain is rarely dangerous. The pain often gets better over time.About half of people with a sudden onset of back pain feel better in just 2 weeks. About 8 in 10 people feel better by 6 weeks.  CAUSES Some common causes of back pain include:  Strain of the muscles or ligaments supporting the spine.   Wear and tear (degeneration) of the spinal discs.   Arthritis.   Direct injury to the back.  DIAGNOSIS Most of the time, the direct cause of low back pain is not known.However, back pain can be treated effectively even when the exact cause of the pain is unknown.Answering your caregiver's questions about your overall health and symptoms is one of the most accurate ways to make sure the cause of your pain is not dangerous. If your caregiver needs more information, he or she may order lab work or imaging tests (X-rays or MRIs).However, even if imaging tests show changes in your back, this usually does not require surgery. HOME CARE INSTRUCTIONS For many people, back pain returns.Since low back pain is rarely dangerous, it is often a condition that people can learn to manageon their own.   Remain active. It is stressful on the back to sit or stand in one place. Do not sit, drive, or stand in one place for more than 30 minutes at a time. Take short walks on level surfaces as soon as pain allows.Try to increase the length of time you walk each day.   Do not stay in bed.Resting more than 1 or 2 days can delay your recovery.   Do not avoid exercise or work.Your body is made to move.It is not dangerous to be active, even though your back may hurt.Your back will likely heal faster if you return to being active before your pain is gone.   Pay attention to your body when you bend and lift. Many people have less discomfortwhen lifting if they bend their knees, keep the load close to their  bodies,and avoid twisting. Often, the most comfortable positions are those that put less stress on your recovering back.   Find a comfortable position to sleep. Use a firm mattress and lie on your side with your knees slightly bent. If you lie on your back, put a pillow under your knees.   Only take over-the-counter or prescription medicines as directed by your caregiver. Over-the-counter medicines to reduce pain and inflammation are often the most helpful.Your caregiver may prescribe muscle relaxant drugs.These medicines help dull your pain so you can more quickly return to your normal activities and healthy exercise.   Put ice on the injured area.   Put ice in a plastic bag.   Place a towel between your skin and the bag.   Leave the ice on for 15 to 20 minutes, 3 to 4 times a day for the first 2 to 3 days. After that, ice and heat may be alternated to reduce pain and spasms.   Ask your caregiver about trying back exercises and gentle massage. This may be of some benefit.   Avoid feeling anxious or stressed.Stress increases muscle tension and can worsen back pain.It is important to recognize when you are anxious or stressed and learn ways to manage it.Exercise is a great option.  SEEK MEDICAL CARE IF:  You have pain that is not relieved with rest or medicine.   You have   relieved with rest or medicine.   You have pain that does not improve in 1 week.   You have new symptoms.   You are generally not feeling well.  SEEK IMMEDIATE MEDICAL CARE IF:    You have pain that radiates from your back into your legs.   You develop new bowel or bladder control problems.   You have unusual weakness or numbness in your arms or legs.   You develop nausea or vomiting.   You develop abdominal pain.   You feel faint.  Document Released: 03/24/2005 Document Revised: 03/13/2011 Document Reviewed: 08/12/2010  ExitCare Patient Information 2012 [employer]  Muscle Strain  A muscle strain, or pulled muscle, occurs when a muscle is over-stretched. A small number of  muscle fibers may also be torn. This is especially common in athletes. This happens when a sudden violent force placed on a muscle pushes it past its capacity. Usually, recovery from a pulled muscle takes 1 to 2 weeks. But complete healing will take 5 to 6 weeks. There are millions of muscle fibers. Following injury, your body will usually return to normal quickly.  HOME CARE INSTRUCTIONS    While awake, apply ice to the sore muscle for 15 to 20 minutes each hour for the first 2 days. Put ice in a plastic bag and place a towel between the bag of ice and your skin.   Do not use the pulled muscle for several days. Do not use the muscle if you have pain.   You may wrap the injured area with an elastic bandage for comfort. Be careful not to bind it too tightly. This may interfere with blood circulation.   Only take over-the-counter or prescription medicines for pain, discomfort, or fever as directed by your caregiver. Do not use aspirin as this will increase bleeding (bruising) at injury site.   Warming up before exercise helps prevent muscle strains.  SEEK MEDICAL CARE IF:   There is increased pain or swelling in the affected area.  MAKE SURE YOU:    Understand these instructions.   Will watch your condition.   Will get help right away if you are not doing well or get worse.  Document Released: 03/24/2005 Document Revised: 03/13/2011 Document Reviewed: 10/21/2006  [employer] Patient Information 2012 [employer].
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
## 2012-01-11 IMAGING — CR DG CHEST 1V PORT
1 series · 1 of 1 positions shown · non-contrast
Comparison: 10/25/2009

CLINICAL DATA: 33-year-old with fever, increasing white count.

PORTABLE CHEST - 1 VIEW

[view not recorded]
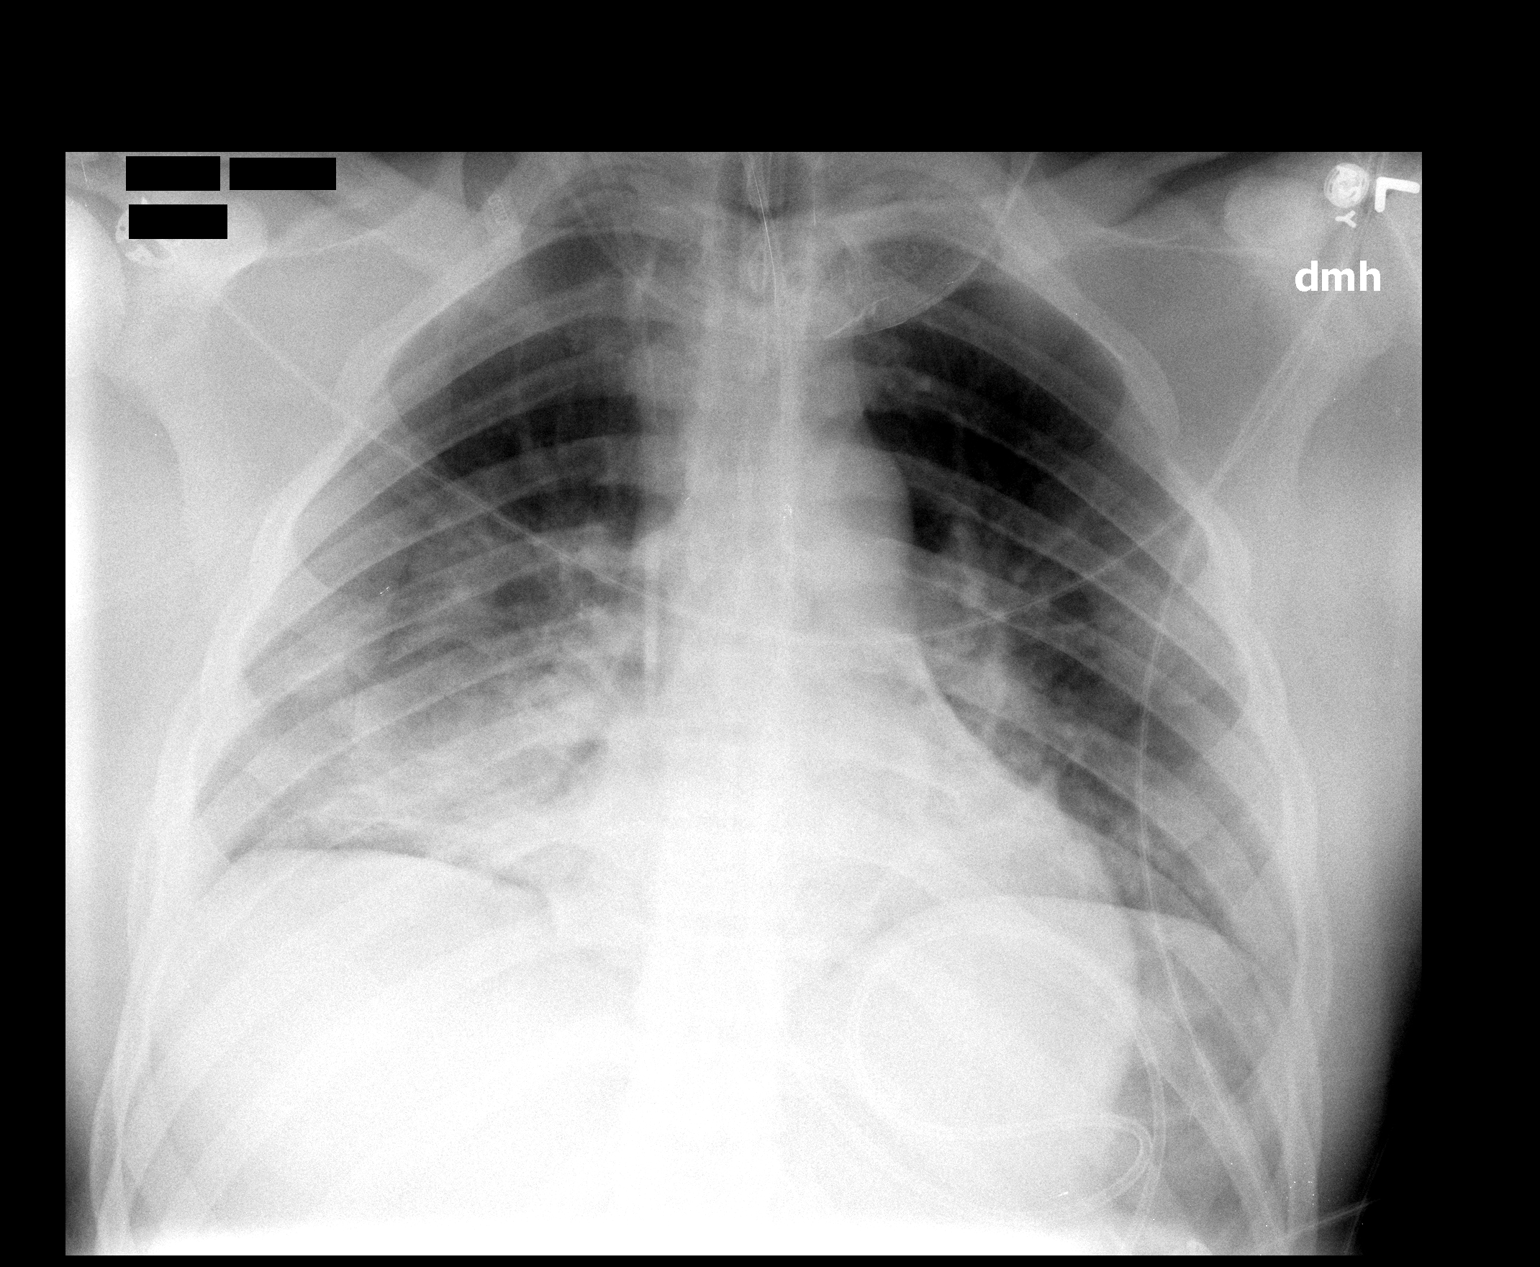

[1 of 1 positions shown; findings below may reference images not displayed]

FINDINGS: The endotracheal tube and right-sided internal jugular
lines are unchanged.  There is been interval replacement of an
enteric tube and feeding tube which is only partially visualized,
part of which is coiled in the gastric body.

The lung volumes remain low and there is increasing hazy opacity
seen in the right middle lobe.  The remainder of the lungs are
unchanged with perihilar and interstitial prominence compatible
mild edema.  The heart is normal in size.  The osseous structures
and upper abdomen are unremarkable.
IMPRESSION: Increasing patchy airspace opacity involving the right middle lobe
concerning for infection.  Interval replacement of the enteric tube
with the feeding tube, detailed above.
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
## 2012-01-18 IMAGING — CR DG CHEST 1V PORT
1 series · 1 of 1 positions shown · non-contrast
Comparison: Chest radiograph 11/01/1998

CLINICAL DATA: Fractured pelvis

PORTABLE CHEST - 1 VIEW

[view not recorded]
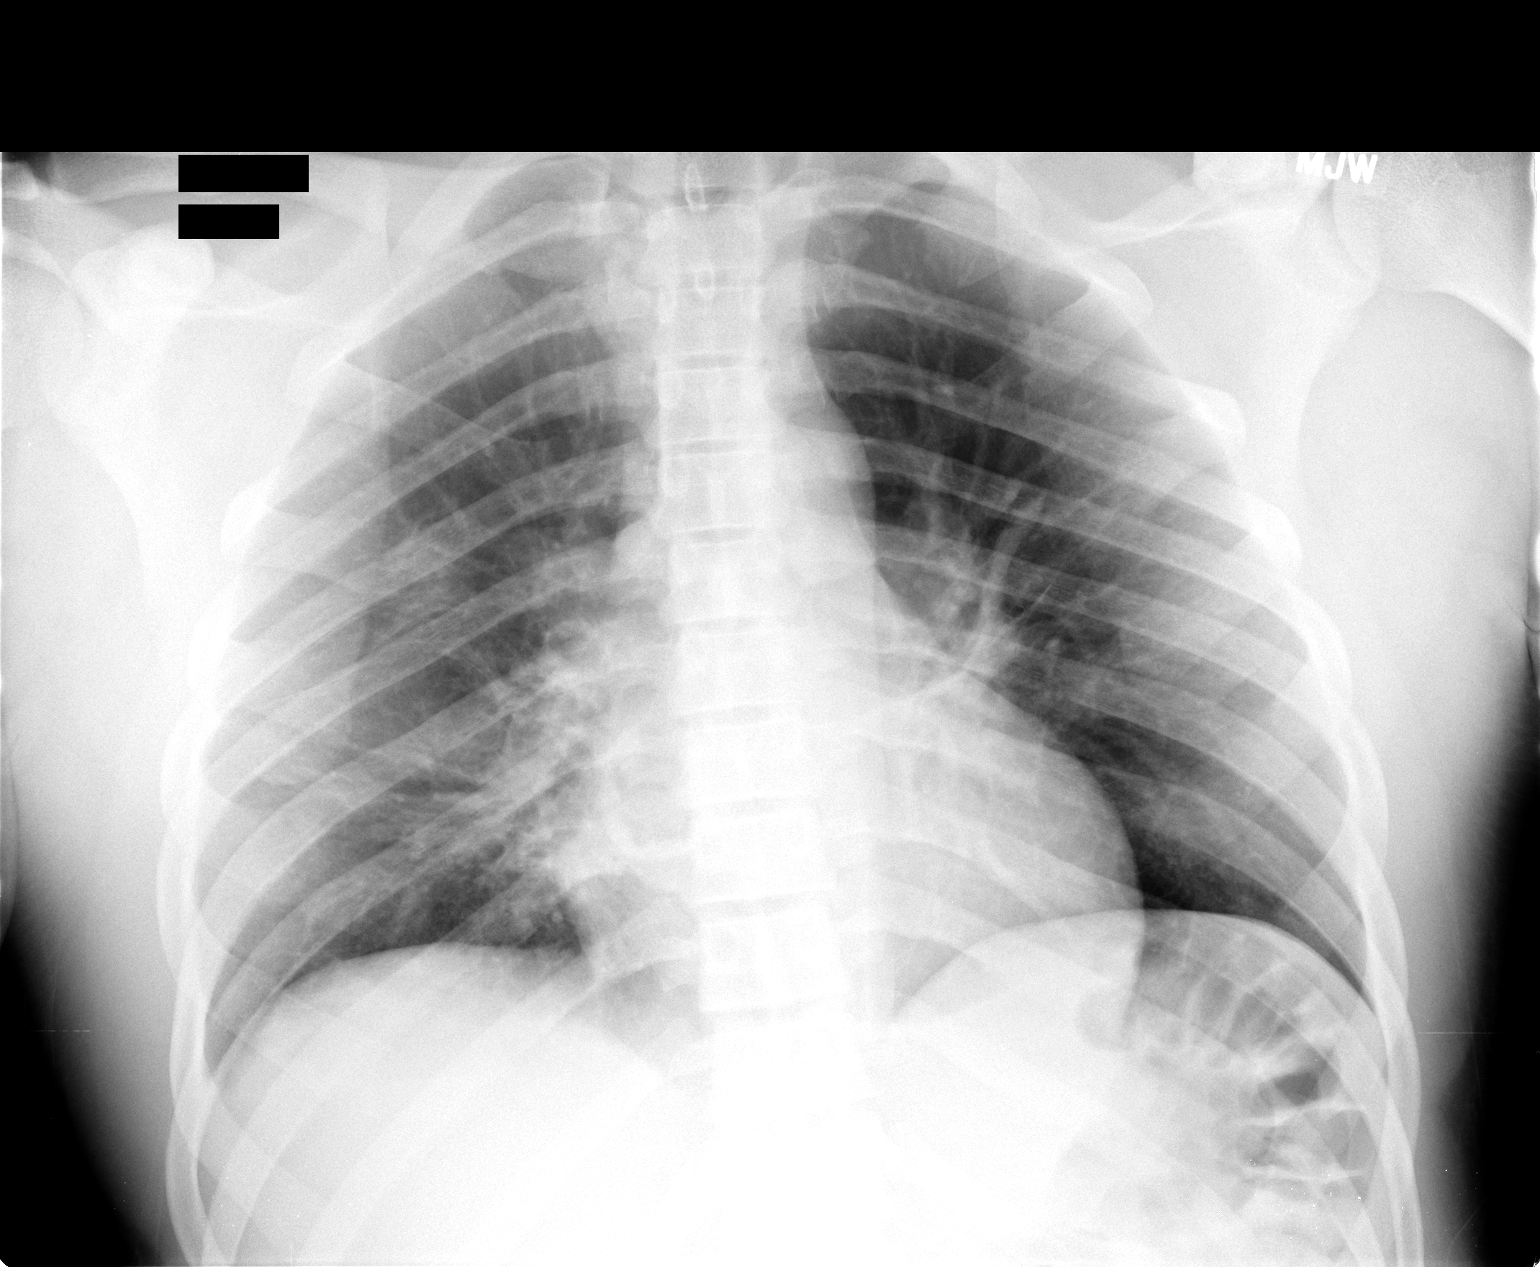

[1 of 1 positions shown; findings below may reference images not displayed]

FINDINGS: The normal mediastinum and cardiac silhouette.  There is
improvement in air space disease within the right lower lobe.
Costophrenic angles are clear.  No evidence of pneumothorax.
IMPRESSION: Improved right lower lobe air space disease.

## 2013-11-30 ENCOUNTER — Ambulatory Visit (INDEPENDENT_AMBULATORY_CARE_PROVIDER_SITE_OTHER): Payer: 59 | Admitting: Emergency Medicine

## 2013-11-30 VITALS — BP 110/84 | HR 84 | Temp 98.6°F | Resp 16 | Ht 72.0 in | Wt 248.0 lb

## 2013-11-30 DIAGNOSIS — J018 Other acute sinusitis: Secondary | ICD-10-CM

## 2013-11-30 MED ORDER — AMOXICILLIN-POT CLAVULANATE 875-125 MG PO TABS: 1.0000 | ORAL_TABLET | Freq: Two times a day (BID) | ORAL | Status: DC

## 2013-11-30 MED ORDER — PSEUDOEPHEDRINE-GUAIFENESIN ER 60-600 MG PO TB12: 1.0000 | ORAL_TABLET | Freq: Two times a day (BID) | ORAL | Status: AC

## 2013-11-30 NOTE — Patient Instructions (Signed)
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
## 2019-02-22 ENCOUNTER — Ambulatory Visit (INDEPENDENT_AMBULATORY_CARE_PROVIDER_SITE_OTHER)
Admission: EM | Admit: 2019-02-22 | Discharge: 2019-02-22 | Disposition: A | Discharge status: Home / Self Care | Payer: 59 | Source: Home / Self Care | Attending: Family Medicine | Admitting: Family Medicine

## 2019-02-22 ENCOUNTER — Other Ambulatory Visit: Payer: Self-pay

## 2019-02-22 ENCOUNTER — Emergency Department (HOSPITAL_COMMUNITY)
Admission: EM | Admit: 2019-02-22 | Discharge: 2019-02-23 | Disposition: A | Discharge status: Home / Self Care | Payer: 59 | Attending: Emergency Medicine | Admitting: Emergency Medicine

## 2019-02-22 ENCOUNTER — Encounter (HOSPITAL_COMMUNITY): Payer: Self-pay

## 2019-02-22 ENCOUNTER — Emergency Department (HOSPITAL_COMMUNITY): Payer: 59

## 2019-02-22 DIAGNOSIS — R1031 Right lower quadrant pain: Secondary | ICD-10-CM | POA: Diagnosis present

## 2019-02-22 DIAGNOSIS — K5900 Constipation, unspecified: Secondary | ICD-10-CM | POA: Diagnosis not present

## 2019-02-22 DIAGNOSIS — R10815 Periumbilic abdominal tenderness: Secondary | ICD-10-CM | POA: Diagnosis not present

## 2019-02-22 DIAGNOSIS — R1032 Left lower quadrant pain: Secondary | ICD-10-CM | POA: Diagnosis not present

## 2019-02-22 DIAGNOSIS — K5732 Diverticulitis of large intestine without perforation or abscess without bleeding: Secondary | ICD-10-CM | POA: Diagnosis not present

## 2019-02-22 DIAGNOSIS — R1033 Periumbilical pain: Secondary | ICD-10-CM

## 2019-02-22 HISTORY — DX: Diverticulitis of intestine, part unspecified, without perforation or abscess without bleeding: K57.92

## 2019-02-22 LAB — CBC
HCT: 45 % (ref 39.0–52.0)
Hemoglobin: 14.6 g/dL (ref 13.0–17.0)
MCH: 30.8 pg (ref 26.0–34.0)
MCHC: 32.4 g/dL (ref 30.0–36.0)
MCV: 94.9 fL (ref 80.0–100.0)
Platelets: 261 10*3/uL (ref 150–400)
RBC: 4.74 MIL/uL (ref 4.22–5.81)
RDW: 13.2 % (ref 11.5–15.5)
WBC: 8.3 10*3/uL (ref 4.0–10.5)
nRBC: 0 % (ref 0.0–0.2)

## 2019-02-22 LAB — COMPREHENSIVE METABOLIC PANEL
ALT: 28 U/L (ref 0–44)
AST: 26 U/L (ref 15–41)
Albumin: 3.8 g/dL (ref 3.5–5.0)
Alkaline Phosphatase: 55 U/L (ref 38–126)
Anion gap: 9 (ref 5–15)
BUN: 16 mg/dL (ref 6–20)
CO2: 28 mmol/L (ref 22–32)
Calcium: 9.4 mg/dL (ref 8.9–10.3)
Chloride: 104 mmol/L (ref 98–111)
Creatinine, Ser: 1.24 mg/dL (ref 0.61–1.24)
GFR calc Af Amer: >60 mL/min (ref >60)
GFR calc non Af Amer: >60 mL/min (ref >60)
Glucose, Bld: 96 mg/dL (ref 70–99)
Potassium: 4.3 mmol/L (ref 3.5–5.1)
Sodium: 141 mmol/L (ref 135–145)
Total Bilirubin: 0.9 mg/dL (ref 0.3–1.2)
Total Protein: 7.3 g/dL (ref 6.5–8.1)

## 2019-02-22 LAB — URINALYSIS, ROUTINE W REFLEX MICROSCOPIC
Bacteria, UA: NONE SEEN
Bilirubin Urine: NEGATIVE
Glucose, UA: NEGATIVE mg/dL
Ketones, ur: NEGATIVE mg/dL
Leukocytes,Ua: NEGATIVE
Nitrite: NEGATIVE
Protein, ur: NEGATIVE mg/dL
Specific Gravity, Urine: 1.029 (ref 1.005–1.030)
pH: 5 (ref 5.0–8.0)

## 2019-02-22 LAB — LIPASE, BLOOD: Lipase: 29 U/L (ref 11–51)

## 2019-02-22 MED ORDER — IOHEXOL 300 MG/ML  SOLN
100.0000 mL | Freq: Once | INTRAMUSCULAR | Status: AC | PRN
  Administered 2019-02-22: 100 mL via INTRAVENOUS

## 2019-02-22 MED ORDER — SODIUM CHLORIDE 0.9% FLUSH: 3.0000 mL | Freq: Once | INTRAVENOUS | Status: DC

## 2019-02-22 NOTE — ED Provider Notes (Signed)
[place]   [id number] 02/22/19 Arrival Time: 6599  ASSESSMENT & PLAN:  1. Left lower quadrant abdominal pain     Unclear etiology. Given exam and his reports of worsening abdominal pain we discussed he is better suited for ED evaluation and imaging if needed. D/C to ED; stable.  Reviewed expectations re: course of current medical issues. Questions answered. Outlined signs and symptoms indicating need for more acute intervention. Patient verbalized understanding. After Visit Summary given.   SUBJECTIVE: History from: patient. [name] is a 43 y.o. male who presents with complaint of fairly persistent abdominal discomfort. Onset gradual, first noted 4-5 days ago. Discomfort described as aching and dull; without radiation; initially felt in RLQ and now periumbilical/LLQ mostly; does report pain waking him at night several nights ago but not since. Overall, symptoms are gradually worsening since beginning. Fever: none suspected. Aggravating factors: have not been identified. Alleviating factors: have not been identified. Symptoms not associated with PO intake. Associated symptoms: mild fatigue. He denies arthralgias, belching, constipation, diarrhea, dysuria, headache, hematuria, myalgias, nausea, sweats and vomiting. Appetite: normal. PO intake: normal. Ambulatory without assistance. Urinary symptoms: none. Bowel movements: have not significantly changed; last bowel movement 1-2 days ago and without blood; "maybe smaller than usual". History of similar: reports distant h/o diverticulitis. S/P appendectomy; date unknown. OTC treatment: Tylenol; mild improvement.  Social History   Tobacco Use  Smoking Status Never Smoker  Smokeless Tobacco Never Used   Social History   Substance and Sexual Activity  Alcohol Use Yes   Comment: socially  Did have "one shot of Vodka" night before symptoms started.  No illicit drug use reported.  Past Surgical History:  Procedure  Laterality Date  . FEMUR SURGERY Left   . HIP SURGERY Left   . KNEE SURGERY Left     ROS: As per HPI. All other systems negative.  OBJECTIVE:  Vitals:   02/22/19 1321  BP: (!) 132/97  Pulse: 90  Resp: 16  Temp: 98.7 F (37.1 C)  TempSrc: Oral  SpO2: 100%    General appearance: alert, oriented, no acute distress HEENT: [name]; AT; oropharynx moist Lungs: clear to auscultation bilaterally; unlabored respirations Heart: regular rate and rhythm Abdomen: soft; without distention; moderate and poorly localized tenderness to palpation over periumbilical area and lower abdomen; normal bowel sounds; without masses or organomegaly; without guarding or rebound tenderness but he does tense up with abdominal palpation Back: without CVA tenderness; FROM at waist Extremities: without LE edema; symmetrical; without gross deformities Skin: warm and dry Neurologic: normal gait Psychological: alert and cooperative; normal mood and affect   No Known Allergies                                             Past Medical History:  Diagnosis Date  . Diverticulitis    Social History   Socioeconomic History  . Marital status: Married    Spouse name: Not on file  . Number of children: Not on file  . Years of education: Not on file  . Highest education level: Not on file  Occupational History  . Not on file  Social Needs  . Financial resource strain: Not on file  . Food insecurity    Worry: Not on file    Inability: Not on file  . Transportation needs    Medical: Not on file  Non-medical: Not on file  Tobacco Use  . Smoking status: Never Smoker  . Smokeless tobacco: Never Used  Substance and Sexual Activity  . Alcohol use: Yes    Comment: socially  . Drug use: No  . Sexual activity: Not on file  Lifestyle  . Physical activity    Days per week: Not on file    Minutes per session: Not on file  . Stress: Not on file  Relationships  . Social Musician on phone: Not on  file    Gets together: Not on file    Attends religious service: Not on file    Active member of club or organization: Not on file    Attends meetings of clubs or organizations: Not on file    Relationship status: Not on file  . Intimate partner violence    Fear of current or ex partner: Not on file    Emotionally abused: Not on file    Physically abused: Not on file    Forced sexual activity: Not on file  Other Topics Concern  . Not on file  Social History Narrative  . Not on file   Family History  Problem Relation Age of Onset  . Diabetes Mother      Mardella Layman, MD 02/22/19 [phone]

## 2019-02-22 NOTE — ED Triage Notes (Signed)
Pt reports lower abd pain that started last Thursday, hx of diverticulitis, last flare up in Aug 2020 sent from [facility] for CT scan. Denies n/v.

## 2019-02-22 NOTE — ED Triage Notes (Signed)
Patient presents to Urgent Care with complaints of abdominal pain since about a week ago. Patient reports he has been diagnosed with diverticulitis in the past, states he has his usual pain plus a dull achy pain in the lower part. Pt takes tylenol for his pain.

## 2019-02-23 MED ORDER — METRONIDAZOLE 500 MG PO TABS
500.0000 mg | ORAL_TABLET | Freq: Once | ORAL | Status: AC
  Administered 2019-02-23: 03:00:00 500 mg via ORAL
  Filled 2019-02-23: qty 1

## 2019-02-23 MED ORDER — METRONIDAZOLE 500 MG PO TABS: 500.0000 mg | ORAL_TABLET | Freq: Three times a day (TID) | ORAL | 0 refills | Status: DC

## 2019-02-23 MED ORDER — METRONIDAZOLE 500 MG PO TABS: 500.0000 mg | ORAL_TABLET | Freq: Three times a day (TID) | ORAL | 0 refills | Status: AC

## 2019-02-23 MED ORDER — OXYCODONE-ACETAMINOPHEN 5-325 MG PO TABS: 1.0000 | ORAL_TABLET | Freq: Four times a day (QID) | ORAL | 0 refills | Status: AC | PRN

## 2019-02-23 MED ORDER — CIPROFLOXACIN HCL 500 MG PO TABS
500.0000 mg | ORAL_TABLET | Freq: Once | ORAL | Status: AC
  Administered 2019-02-23: 500 mg via ORAL
  Filled 2019-02-23: qty 1

## 2019-02-23 MED ORDER — CIPROFLOXACIN HCL 500 MG PO TABS: 500.0000 mg | ORAL_TABLET | Freq: Two times a day (BID) | ORAL | 0 refills | Status: AC

## 2019-02-23 MED ORDER — CIPROFLOXACIN HCL 500 MG PO TABS: 500.0000 mg | ORAL_TABLET | Freq: Two times a day (BID) | ORAL | 0 refills | Status: DC

## 2019-02-23 MED ORDER — OXYCODONE-ACETAMINOPHEN 5-325 MG PO TABS: 1.0000 | ORAL_TABLET | Freq: Four times a day (QID) | ORAL | 0 refills | Status: DC | PRN

## 2019-02-23 NOTE — Discharge Instructions (Signed)
Take antibiotics as prescribed, and Percocet for pain as directed.   It is recommended that you take Miralax and a stool softener as well.  Follow up with [facility] for further management as needed.   Return to the emergency department with any high fever, uncontrolled pain or new concern.
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
## 2020-07-05 ENCOUNTER — Emergency Department (HOSPITAL_BASED_OUTPATIENT_CLINIC_OR_DEPARTMENT_OTHER)
Admission: EM | Admit: 2020-07-05 | Discharge: 2020-07-05 | Disposition: A | Discharge status: Home / Self Care | Payer: 59 | Attending: Emergency Medicine | Admitting: Emergency Medicine

## 2020-07-05 ENCOUNTER — Emergency Department (HOSPITAL_BASED_OUTPATIENT_CLINIC_OR_DEPARTMENT_OTHER): Payer: 59

## 2020-07-05 ENCOUNTER — Other Ambulatory Visit: Payer: Self-pay

## 2020-07-05 DIAGNOSIS — U071 COVID-19: Secondary | ICD-10-CM | POA: Insufficient documentation

## 2020-07-05 DIAGNOSIS — R519 Headache, unspecified: Secondary | ICD-10-CM | POA: Diagnosis present

## 2020-07-05 LAB — COMPREHENSIVE METABOLIC PANEL
ALT: 33 U/L (ref 0–44)
AST: 33 U/L (ref 15–41)
Albumin: 4.2 g/dL (ref 3.5–5.0)
Alkaline Phosphatase: 46 U/L (ref 38–126)
Anion gap: 9 (ref 5–15)
BUN: 14 mg/dL (ref 6–20)
CO2: 27 mmol/L (ref 22–32)
Calcium: 9.2 mg/dL (ref 8.9–10.3)
Chloride: 98 mmol/L (ref 98–111)
Creatinine, Ser: 1.35 mg/dL — ABNORMAL HIGH (ref 0.61–1.24)
GFR, Estimated: >60 mL/min (ref >60)
Glucose, Bld: 120 mg/dL — ABNORMAL HIGH (ref 70–99)
Potassium: 3.8 mmol/L (ref 3.5–5.1)
Sodium: 134 mmol/L — ABNORMAL LOW (ref 135–145)
Total Bilirubin: 0.7 mg/dL (ref 0.3–1.2)
Total Protein: 8.1 g/dL (ref 6.5–8.1)

## 2020-07-05 LAB — URINALYSIS, ROUTINE W REFLEX MICROSCOPIC
Bilirubin Urine: NEGATIVE
Glucose, UA: NEGATIVE mg/dL
Ketones, ur: NEGATIVE mg/dL
Leukocytes,Ua: NEGATIVE
Nitrite: NEGATIVE
Protein, ur: NEGATIVE mg/dL
Specific Gravity, Urine: 1.02 (ref 1.005–1.030)
pH: 6 (ref 5.0–8.0)

## 2020-07-05 LAB — CBC WITH DIFFERENTIAL/PLATELET
Abs Immature Granulocytes: 0.02 10*3/uL (ref 0.00–0.07)
Basophils Absolute: 0.1 10*3/uL (ref 0.0–0.1)
Basophils Relative: 1 %
Eosinophils Absolute: 0.1 10*3/uL (ref 0.0–0.5)
Eosinophils Relative: 1 %
HCT: 45.8 % (ref 39.0–52.0)
Hemoglobin: 15.4 g/dL (ref 13.0–17.0)
Immature Granulocytes: 0 %
Lymphocytes Relative: 10 %
Lymphs Abs: 0.6 10*3/uL — ABNORMAL LOW (ref 0.7–4.0)
MCH: 30.9 pg (ref 26.0–34.0)
MCHC: 33.6 g/dL (ref 30.0–36.0)
MCV: 92 fL (ref 80.0–100.0)
Monocytes Absolute: 1.1 10*3/uL — ABNORMAL HIGH (ref 0.1–1.0)
Monocytes Relative: 17 %
Neutro Abs: 4.5 10*3/uL (ref 1.7–7.7)
Neutrophils Relative %: 71 %
Platelets: 224 10*3/uL (ref 150–400)
RBC: 4.98 MIL/uL (ref 4.22–5.81)
RDW: 13.7 % (ref 11.5–15.5)
WBC: 6.3 10*3/uL (ref 4.0–10.5)
nRBC: 0 % (ref 0.0–0.2)

## 2020-07-05 LAB — PROTIME-INR
INR: 1 (ref 0.8–1.2)
Prothrombin Time: 12.8 seconds (ref 11.4–15.2)

## 2020-07-05 LAB — URINALYSIS, MICROSCOPIC (REFLEX): Squamous Epithelial / HPF: NONE SEEN (ref 0–5)

## 2020-07-05 LAB — LACTIC ACID, PLASMA: Lactic Acid, Venous: 1.2 mmol/L (ref 0.5–1.9)

## 2020-07-05 LAB — TROPONIN I (HIGH SENSITIVITY): Troponin I (High Sensitivity): 5 ng/L (ref <18)

## 2020-07-05 LAB — APTT: aPTT: 30 seconds (ref 24–36)

## 2020-07-05 LAB — RESP PANEL BY RT-PCR (FLU A&B, COVID) ARPGX2
Influenza A by PCR: NEGATIVE
Influenza B by PCR: NEGATIVE
SARS Coronavirus 2 by RT PCR: POSITIVE — AB

## 2020-07-05 MED ORDER — ACETAMINOPHEN 325 MG PO TABS
650.0000 mg | ORAL_TABLET | Freq: Once | ORAL | Status: AC
  Administered 2020-07-05: 650 mg via ORAL
  Filled 2020-07-05: qty 2

## 2020-07-05 MED ORDER — IBUPROFEN 400 MG PO TABS
600.0000 mg | ORAL_TABLET | Freq: Once | ORAL | Status: AC
  Administered 2020-07-05: 600 mg via ORAL
  Filled 2020-07-05: qty 1

## 2020-07-05 MED ORDER — LACTATED RINGERS IV BOLUS
1000.0000 mL | Freq: Once | INTRAVENOUS | Status: AC
  Administered 2020-07-05: 1000 mL via INTRAVENOUS

## 2020-07-05 MED ORDER — LACTATED RINGERS IV BOLUS: 1000.0000 mL | Freq: Once | INTRAVENOUS | Status: DC

## 2020-07-05 NOTE — ED Notes (Signed)
Pt c/o headache, upper back pain and chills since yesterday Does not take any daily meds and has not taken anything since last night nightquil Denies V/D

## 2020-07-05 NOTE — ED Notes (Signed)
Pt. Walked in room and stood and walked in place with HR not above 113-114 then immediately down to 108/  Pt. sats stayed 98% to 93%.  Pt. Is in no distress only c/o headache.  Pt. Will get Ibuprofen for headache.
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
## 2020-07-05 NOTE — Discharge Instructions (Signed)
At this time there does not appear to be the presence of an emergent medical condition, however there is always the potential for conditions to change. Please read and follow the below instructions.  Please return to the Emergency Department immediately for any new or worsening symptoms. Please be sure to follow up with your Primary Care Provider within one week regarding your visit today; please call their office to schedule an appointment even if you are feeling better for a follow-up visit. Your Covid test was positive today.  You have been referred to the ambulatory COVID treatment clinic, they should call you within the next 1-2 days to see if you are eligible for any of their treatments.  Please be sure to answer your phone because the call will come from an unrecognized number. Please drink plenty of water and get plenty of rest.  Please isolate for the next 10 days to avoid spread of Covid to others. Please take Ibuprofen (Advil, motrin) and Tylenol (acetaminophen) to relieve your pain.  You may take up to 400 MG (2 pills) of normal strength ibuprofen every 8 hours as needed.  You make take tylenol, up to 500 mg (one extra strength pills) every 8 hours as needed. It is safe to take ibuprofen and tylenol at the same time as they work differently. Do not take more than 3,000 mg tylenol in a 24 hour period (not more than one dose every 8 hours.  Please check all medication labels as many medications such as pain and cold medications may contain tylenol.  Do not drink alcohol while taking these medications.  Do not take other NSAID'S while taking ibuprofen (such as aleve or naproxen).  Please take ibuprofen with food to decrease stomach upset.  Go to the nearest Emergency Department immediately if: You have fever or chills Trouble breathing Persistent pain or pressure in the chest New confusion Inability to wake or stay awake Pale, gray, or blue-colored skin, lips, or nail beds, depending on skin  tone You have any new/concerning or worsening of symptoms   Please read the additional information packets attached to your discharge summary.  Do not take your medicine if  develop an itchy rash, swelling in your mouth or lips, or difficulty breathing; call 911 and seek immediate emergency medical attention if this occurs.  You may review your lab tests and imaging results in their entirety on your MyChart account.  Please discuss all results of fully with your primary care provider and other specialist at your follow-up visit.  Note: Portions of this text may have been transcribed using voice recognition software. Every effort was made to ensure accuracy; however, inadvertent computerized transcription errors may still be present.

## 2020-07-05 NOTE — Progress Notes (Signed)
EKG obtained per physician order. Pt placed on cardiac monitor, pulse oximetry, blood pressure monitoring and temperature taken. EKG given to Dr. Particia Nearing. RN at bedside and aware of pt temperature. RT will continue to monitor and be available as needed.

## 2020-07-06 ENCOUNTER — Telehealth: Payer: Self-pay

## 2020-07-06 LAB — URINE CULTURE: Culture: NO GROWTH

## 2020-07-06 NOTE — Telephone Encounter (Signed)
Called to discuss with patient about COVID-19 symptoms and the use of one of the available treatments for those with mild to moderate Covid symptoms and at a high risk of hospitalization.  Pt appears to qualify for outpatient treatment due to co-morbid conditions and/or a member of an at-risk group in accordance with the FDA Emergency Use Authorization.    Symptom onset: 07/04/20 Chills, headache Vaccinated: Yes Booster? No Immunocompromised? No Qualifiers: Diverticulitis   Pt. Would like to speak with APP. Esther Hardy

## 2020-07-07 ENCOUNTER — Other Ambulatory Visit: Payer: Self-pay | Admitting: Physician Assistant

## 2020-07-07 ENCOUNTER — Other Ambulatory Visit (HOSPITAL_COMMUNITY): Payer: Self-pay

## 2020-07-07 DIAGNOSIS — U071 COVID-19: Secondary | ICD-10-CM

## 2020-07-07 MED ORDER — NIRMATRELVIR/RITONAVIR (PAXLOVID)TABLET
3.0000 | ORAL_TABLET | Freq: Two times a day (BID) | ORAL | 0 refills | Status: AC
  Filled 2020-07-07: qty 30, 5d supply, fill #0

## 2020-07-07 NOTE — Progress Notes (Signed)
Outpatient Oral COVID Treatment Note  I connected with [name] on 07/07/2020/8:44 AM by telephone and verified that I am speaking with the correct person using two identifiers.  I discussed the limitations, risks, security, and privacy concerns of performing an evaluation and management service by telephone and the availability of in person appointments. I also discussed with the patient that there may be a patient responsible charge related to this service. The patient expressed understanding and agreed to proceed.  Patient location: home Provider location: home office  Diagnosis: COVID-19 infection  Purpose of visit: Discussion of potential use of Molnupiravir or Paxlovid, a new treatment for mild to moderate COVID-19 viral infection in non-hospitalized patients.   Subjective: Patient is a 45 y.o. male who has been diagnosed with COVID 19 viral infection.  Their symptoms began on 3/30 with cough and chills .    Past Medical History:  Diagnosis Date  . Diverticulitis     No Known Allergies   Current Outpatient Medications:  .  nirmatrelvir/ritonavir EUA (PAXLOVID) TABS, Take 3 tablets by mouth 2 (two) times daily for 5 days. Take nirmatrelvir (150 mg) two tablet(s) twice daily for 5 days and ritonavir (100 mg) one tablet twice daily for 5 days., Disp: 30 tablet, Rfl: 0 .  amoxicillin-clavulanate (AUGMENTIN) 875-125 MG per tablet, Take 1 tablet by mouth 2 (two) times daily., Disp: 20 tablet, Rfl: 0 .  ciprofloxacin (CIPRO) 500 MG tablet, Take 1 tablet (500 mg total) by mouth every 12 (twelve) hours., Disp: 14 tablet, Rfl: 0 .  metroNIDAZOLE (FLAGYL) 500 MG tablet, Take 1 tablet (500 mg total) by mouth 3 (three) times daily., Disp: 21 tablet, Rfl: 0 .  oxyCODONE-acetaminophen (PERCOCET/ROXICET) 5-325 MG tablet, Take 1 tablet by mouth every 6 (six) hours as needed for severe pain., Disp: 15 tablet, Rfl: 0  Objective: Patient sound okay on phone.  They are in no apparent distress.   Breathing is non labored.  Mood and behavior are normal.  Laboratory Data:  Recent Results (from the past 2160 hour(s))  Resp Panel by RT-PCR (Flu A&B, Covid) Nasopharyngeal Swab     Status: Abnormal   Collection Time: 07/05/20 11:32 AM   Specimen: Nasopharyngeal Swab; Nasopharyngeal(NP) swabs in vial transport medium  Result Value Ref Range   SARS Coronavirus 2 by RT PCR POSITIVE (A) NEGATIVE    Comment: RESULT CALLED TO, READ BACK BY AND VERIFIED WITH: Cindy Roughgarden @ 1243 ON 07/05/2020 BY Punjtan,G (NOTE) SARS-CoV-2 target nucleic acids are DETECTED.  The SARS-CoV-2 RNA is generally detectable in upper respiratory specimens during the acute phase of infection. Positive results are indicative of the presence of the identified virus, but do not rule out bacterial infection or co-infection with other pathogens not detected by the test. Clinical correlation with patient history and other diagnostic information is necessary to determine patient infection status. The expected result is Negative.  Fact Sheet for Patients: BloggerCourse.comhttps://www.fda.gov/media/152166/download  Fact Sheet for Healthcare Providers: SeriousBroker.ithttps://www.fda.gov/media/152162/download  This test is not yet approved or cleared by the [place] FDA and  has been authorized for detection and/or diagnosis of SARS-CoV-2 by FDA under an Emergency Use Authorization (EUA).  This EUA will remain in effect (meanin g this test can be used) for the duration of  the COVID-19 declaration under Section 564(b)(1) of the Act, 21 U.S.C. section 360bbb-3(b)(1), unless the authorization is terminated or revoked sooner.     Influenza A by PCR NEGATIVE NEGATIVE   Influenza B by PCR NEGATIVE NEGATIVE  Comment: (NOTE) The Xpert Xpress SARS-CoV-2/FLU/RSV plus assay is intended as an aid in the diagnosis of influenza from Nasopharyngeal swab specimens and should not be used as a sole basis for treatment. Nasal washings and aspirates are  unacceptable for Xpert Xpress SARS-CoV-2/FLU/RSV testing.  Fact Sheet for Patients: BloggerCourse.com  Fact Sheet for Healthcare Providers: SeriousBroker.it  This test is not yet approved or cleared by the [place] FDA and has been authorized for detection and/or diagnosis of SARS-CoV-2 by FDA under an Emergency Use Authorization (EUA). This EUA will remain in effect (meaning this test can be used) for the duration of the COVID-19 declaration under Section 564(b)(1) of the Act, 21 U.S.C. section 360bbb-3(b)(1), unless the authorization is terminated or revoked.  Performed at [facility], [address] [place], [place] [address]   Lactic acid, plasma     Status: None   Collection Time: 07/05/20 11:32 AM  Result Value Ref Range   Lactic Acid, Venous 1.2 0.5 - 1.9 mmol/L    Comment: Performed at [facility], [address] [place], [place] [address]  Comprehensive metabolic panel     Status: Abnormal   Collection Time: 07/05/20 11:32 AM  Result Value Ref Range   Sodium 134 (L) 135 - 145 mmol/L   Potassium 3.8 3.5 - 5.1 mmol/L   Chloride 98 98 - 111 mmol/L   CO2 27 22 - 32 mmol/L   Glucose, Bld 120 (H) 70 - 99 mg/dL    Comment: Glucose reference range applies only to samples taken after fasting for at least 8 hours.   BUN 14 6 - 20 mg/dL   Creatinine, Ser 8.93 (H) 0.61 - 1.24 mg/dL   Calcium 9.2 8.9 - 81.0 mg/dL   Total Protein 8.1 6.5 - 8.1 g/dL   Albumin 4.2 3.5 - 5.0 g/dL   AST 33 15 - 41 U/L   ALT 33 0 - 44 U/L   Alkaline Phosphatase 46 38 - 126 U/L   Total Bilirubin 0.7 0.3 - 1.2 mg/dL   GFR, Estimated >17 >51 mL/min    Comment: (NOTE) Calculated using the CKD-EPI Creatinine Equation (2021)    Anion gap 9 5 - 15    Comment: Performed at [facility], [address] [place], [place] [address]  CBC WITH DIFFERENTIAL     Status: Abnormal   Collection Time: 07/05/20 11:32 AM   Result Value Ref Range   WBC 6.3 4.0 - 10.5 K/uL   RBC 4.98 4.22 - 5.81 MIL/uL   Hemoglobin 15.4 13.0 - 17.0 g/dL   HCT 27.7 82.4 - 23.5 %   MCV 92.0 80.0 - 100.0 fL   MCH 30.9 26.0 - 34.0 pg   MCHC 33.6 30.0 - 36.0 g/dL   RDW 36.1 44.3 - 15.4 %   Platelets 224 150 - 400 K/uL   nRBC 0.0 0.0 - 0.2 %   Neutrophils Relative % 71 %   Neutro Abs 4.5 1.7 - 7.7 K/uL   Lymphocytes Relative 10 %   Lymphs Abs 0.6 (L) 0.7 - 4.0 K/uL   Monocytes Relative 17 %   Monocytes Absolute 1.1 (H) 0.1 - 1.0 K/uL   Eosinophils Relative 1 %   Eosinophils Absolute 0.1 0.0 - 0.5 K/uL   Basophils Relative 1 %   Basophils Absolute 0.1 0.0 - 0.1 K/uL   Immature Granulocytes 0 %   Abs Immature Granulocytes 0.02 0.00 - 0.07 K/uL    Comment:  Performed at [facility], [address] [place], [place] [address]  Protime-INR     Status: None   Collection Time: 07/05/20 11:32 AM  Result Value Ref Range   Prothrombin Time 12.8 11.4 - 15.2 seconds   INR 1.0 0.8 - 1.2    Comment: (NOTE) INR goal varies based on device and disease states. Performed at [facility], [address] [place], [place] [address]   APTT     Status: None   Collection Time: 07/05/20 11:32 AM  Result Value Ref Range   aPTT 30 24 - 36 seconds    Comment: Performed at [facility], [address] [place], [place] [address]  Blood culture (routine single)     Status: None (Preliminary result)   Collection Time: 07/05/20 11:32 AM   Specimen: BLOOD  Result Value Ref Range   Specimen Description      BLOOD LEFT ANTECUBITAL Performed at [facility], [address] [place], [place] [address]    Special Requests      BOTTLES DRAWN AEROBIC AND ANAEROBIC Blood Culture adequate volume Performed at [facility], [address] [place], [place] [address]    Culture      NO GROWTH < 24 HOURS Performed at [facility], [address] [place], [place] [address]    Report  Status PENDING   Urinalysis, Routine w reflex microscopic Nasopharyngeal Swab     Status: Abnormal   Collection Time: 07/05/20 11:32 AM  Result Value Ref Range   Color, Urine YELLOW YELLOW   APPearance CLEAR CLEAR   Specific Gravity, Urine 1.020 1.005 - 1.030   pH 6.0 5.0 - 8.0   Glucose, UA NEGATIVE NEGATIVE mg/dL   Hgb urine dipstick MODERATE (A) NEGATIVE   Bilirubin Urine NEGATIVE NEGATIVE   Ketones, ur NEGATIVE NEGATIVE mg/dL   Protein, ur NEGATIVE NEGATIVE mg/dL   Nitrite NEGATIVE NEGATIVE   Leukocytes,Ua NEGATIVE NEGATIVE    Comment: Performed at [facility], [address] [place], [place] [address]  Urine culture     Status: None   Collection Time: 07/05/20 11:32 AM   Specimen: In/Out Cath Urine  Result Value Ref Range   Specimen Description      IN/OUT CATH URINE Performed at [facility], [address] [place], [place] [address]    Special Requests      NONE Performed at [facility], [address] [place], [place] [address]    Culture      NO GROWTH Performed at [facility], [address] [place], [place] [address]    Report Status 07/06/2020 FINAL   Troponin I (High Sensitivity)     Status: None   Collection Time: 07/05/20 11:32 AM  Result Value Ref Range   Troponin I (High Sensitivity) 5 <18 ng/L    Comment: (NOTE) Elevated high sensitivity troponin I (hsTnI) values and significant  changes across serial measurements may suggest ACS but many other  chronic and acute conditions are known to elevate hsTnI results.  Refer to the "Links" section for chest pain algorithms and additional  guidance. Performed at [facility], [address] [place], [place] [address]   Urinalysis, Microscopic (reflex)     Status: Abnormal   Collection Time: 07/05/20 11:32 AM  Result Value Ref Range   RBC / HPF 6-10 0 -  5 RBC/hpf   WBC, UA 0-5 0 - 5 WBC/hpf   Bacteria, UA RARE (A) NONE SEEN   Squamous Epithelial / LPF  NONE SEEN 0 - 5    Comment: Performed at [facility], [address], [place], [place] [address]     Assessment: 45 y.o. male with mild/moderate COVID 19 viral infection diagnosed on 3/31 at high risk for progression to severe COVID 19.  Plan:  This patient is a 45 y.o. male that meets the following criteria for Emergency Use Authorization of: Paxlovid 1. Age >12 yr AND > 40 kg 2. SARS-COV-2 positive test 3. Symptom onset < 5 days 4. Mild-to-moderate COVID disease with high risk for severe progression to hospitalization or death  I have spoken and communicated the following to the patient or parent/caregiver regarding: 1. Paxlovid is an unapproved drug that is authorized for use under an Emergency Use Authorization.  2. There are no adequate, approved, available products for the treatment of COVID-19 in adults who have mild-to-moderate COVID-19 and are at high risk for progressing to severe COVID-19, including hospitalization or death. 3. Other therapeutics are currently authorized. For additional information on all products authorized for treatment or prevention of COVID-19, please see https://www.graham-miller.[email or web address]/.  4. There are benefits and risks of taking this treatment as outlined in the "Fact Sheet for Patients and Caregivers."  5. "Fact Sheet for Patients and Caregivers" was reviewed with patient. A hard copy will be provided to patient from pharmacy prior to the patient receiving treatment. 6. Patients should continue to self-isolate and use infection control measures (e.g., wear mask, isolate, social distance, avoid sharing personal items, clean and disinfect "high touch" surfaces, and frequent handwashing) according to CDC guidelines.  7. The patient or parent/caregiver has the option to accept or refuse treatment. 8. Patient medication history was reviewed for potential drug  interactions:Interaction with home meds: Oxycodone-acetominophen but pt is no longer taking this medication 9. Patient's GFR was calculated to be >60, and they were therefore prescribed Normal dose (GFR>60) - nirmatrelvir 150mg  tab (2 tablet) by mouth twice daily AND ritonavir 100mg  tab (1 tablet) by mouth twice daily   After reviewing above information with the patient, the patient agrees to receive Paxlovid.  Follow up instructions:    . Take prescription BID x 5 days as directed . Reach out to pharmacist for counseling on medication if desired . For concerns regarding further COVID symptoms please follow up with your PCP or urgent care . For urgent or life-threatening issues, seek care at your local emergency department  The patient was provided an opportunity to ask questions, and all were answered. The patient agreed with the plan and demonstrated an understanding of the instructions.   Script sent to [facility] and opted to pick up RX.  The patient was advised to call their PCP or seek an in-person evaluation if the symptoms worsen or if the condition fails to improve as anticipated.   I provided 10 minutes of non face-to-face telephone visit time during this encounter, and > 50% was spent counseling as documented under my assessment & plan.  , PA-C 07/07/2020 Cline Crock AM

## 2020-07-10 LAB — CULTURE, BLOOD (SINGLE)
Culture: NO GROWTH
Special Requests: ADEQUATE

## 2023-01-08 ENCOUNTER — Encounter (HOSPITAL_BASED_OUTPATIENT_CLINIC_OR_DEPARTMENT_OTHER): Payer: Self-pay | Admitting: Emergency Medicine

## 2023-01-08 ENCOUNTER — Other Ambulatory Visit: Payer: Self-pay

## 2023-01-08 DIAGNOSIS — U071 COVID-19: Secondary | ICD-10-CM | POA: Insufficient documentation

## 2023-01-08 DIAGNOSIS — R519 Headache, unspecified: Secondary | ICD-10-CM | POA: Diagnosis present

## 2023-01-08 LAB — SARS CORONAVIRUS 2 BY RT PCR: SARS Coronavirus 2 by RT PCR: POSITIVE — AB

## 2023-01-08 MED ORDER — ACETAMINOPHEN 325 MG PO TABS
650.0000 mg | ORAL_TABLET | Freq: Once | ORAL | Status: AC | PRN
Start: 2023-01-08 — End: 2023-01-08
  Administered 2023-01-08: 650 mg via ORAL
  Filled 2023-01-08: qty 2

## 2023-01-08 NOTE — ED Triage Notes (Signed)
Patient complaining of headache, chills, muscle and joint pain since approx 11:30am today. Denies recent sick contacts. Denies fevers.

## 2023-01-09 ENCOUNTER — Emergency Department (HOSPITAL_BASED_OUTPATIENT_CLINIC_OR_DEPARTMENT_OTHER)
Admission: EM | Admit: 2023-01-09 | Discharge: 2023-01-09 | Disposition: A | Discharge status: Home / Self Care | Payer: Commercial Managed Care - HMO | Attending: Emergency Medicine | Admitting: Emergency Medicine

## 2023-01-09 DIAGNOSIS — U071 COVID-19: Secondary | ICD-10-CM

## 2023-01-09 MED ORDER — IBUPROFEN 800 MG PO TABS: 800.0000 mg | ORAL_TABLET | Freq: Three times a day (TID) | ORAL | 0 refills | Status: AC

## 2023-01-09 MED ORDER — IBUPROFEN 800 MG PO TABS
800.0000 mg | ORAL_TABLET | Freq: Once | ORAL | Status: AC
Start: 2023-01-09 — End: 2023-01-09
  Administered 2023-01-09: 800 mg via ORAL
  Filled 2023-01-09: qty 1

## 2023-01-09 NOTE — ED Provider Notes (Signed)
[place] [facility] AT [facility] Provider Note   CSN: [id number] Arrival date & time: 01/08/23  2057     History  Chief Complaint  Patient presents with   Headache    [name] is a 47 y.o. male.  The history is provided by the patient and the spouse.  Headache Pain location:  Frontal Quality:  Dull Onset quality:  Gradual Duration:  2 days Timing:  Constant Progression:  Waxing and waning Chronicity:  New Context: not straining   Relieved by:  Nothing Worsened by:  Nothing Ineffective treatments:  Acetaminophen Associated symptoms: myalgias   Associated symptoms: no cough, no diarrhea and no fever   Associated symptoms comment:  Muscle pain. No cough no SOB no diarrhea      Home Medications Prior to Admission medications   Medication Sig Start Date End Date Taking? Authorizing Provider  amoxicillin-clavulanate (AUGMENTIN) 875-125 MG per tablet Take 1 tablet by mouth 2 (two) times daily. 11/30/13   Carmelina Dane, MD  ciprofloxacin (CIPRO) 500 MG tablet Take 1 tablet (500 mg total) by mouth every 12 (twelve) hours. 02/23/19   Elpidio Anis, PA-C  metroNIDAZOLE (FLAGYL) 500 MG tablet Take 1 tablet (500 mg total) by mouth 3 (three) times daily. 02/23/19   Elpidio Anis, PA-C  oxyCODONE-acetaminophen (PERCOCET/ROXICET) 5-325 MG tablet Take 1 tablet by mouth every 6 (six) hours as needed for severe pain. 02/23/19   Elpidio Anis, PA-C      Allergies    Patient has no known allergies.    Review of Systems   Review of Systems  Constitutional:  Negative for fever.  HENT:  Negative for facial swelling.   Respiratory:  Negative for cough, shortness of breath, wheezing and stridor.   Gastrointestinal:  Negative for diarrhea.  Musculoskeletal:  Positive for myalgias.  Neurological:  Positive for headaches.  All other systems reviewed and are negative.   Physical Exam Updated Vital Signs BP (!) 149/107   Pulse 91   Temp 98.4 F  (36.9 C) (Oral)   Resp 20   Ht 6' (1.829 m)   Wt 113.4 kg   SpO2 95%   BMI 33.91 kg/m  Physical Exam Constitutional:      General: He is not in acute distress.    Appearance: He is well-developed. He is not diaphoretic.  HENT:     Head: Normocephalic and atraumatic.  Eyes:     Conjunctiva/sclera: Conjunctivae normal.     Pupils: Pupils are equal, round, and reactive to light.  Cardiovascular:     Rate and Rhythm: Normal rate and regular rhythm.  Pulmonary:     Effort: Pulmonary effort is normal.     Breath sounds: Normal breath sounds. No wheezing or rales.  Abdominal:     General: Bowel sounds are normal.     Palpations: Abdomen is soft.     Tenderness: There is no abdominal tenderness. There is no guarding or rebound.  Musculoskeletal:        General: Normal range of motion.     Cervical back: Normal range of motion and neck supple.  Skin:    General: Skin is warm and dry.  Neurological:     Mental Status: He is alert and oriented to person, place, and time.     ED Results / Procedures / Treatments   Labs (all labs ordered are listed, but only abnormal results are displayed) Labs Reviewed  SARS CORONAVIRUS 2 BY RT PCR - Abnormal; Notable for the  following components:      Result Value   SARS Coronavirus 2 by RT PCR POSITIVE (*)    All other components within normal limits    EKG None  Radiology No results found.  Procedures Procedures    Medications Ordered in ED Medications  acetaminophen (TYLENOL) tablet 650 mg (650 mg Oral Given 01/08/23 2112)  ibuprofen (ADVIL) tablet 800 mg (800 mg Oral Given 01/09/23 0249)    ED Course/ Medical Decision Making/ A&P                                 Medical Decision Making Body aches and headache x 2 days   Amount and/or Complexity of Data Reviewed External Data Reviewed: notes.    Details: Previous notes reviewed  Labs: ordered.    Details: COVID is positive   Risk OTC drugs. Prescription drug  management. Risk Details: Normal O2 saturation.  No SOB.  Well appearing, neck is supple.  Stable for discharge.  Alternate tylenol and ibuprofen.  Strict return.      Final Clinical Impression(s) / ED Diagnoses Final diagnoses:  COVID-19   Return for intractable cough, coughing up blood, fevers > 100.4 unrelieved by medication, shortness of breath, intractable vomiting, chest pain, shortness of breath, weakness, numbness, changes in speech, facial asymmetry, abdominal pain, passing out, Inability to tolerate liquids or food, cough, altered mental status or any concerns. No signs of systemic illness or infection. The patient is nontoxic-appearing on exam and vital signs are within normal limits.  I have reviewed the triage vital signs and the nursing notes. Pertinent labs & imaging results that were available during my care of the patient were reviewed by me and considered in my medical decision making (see chart for details). After history, exam, and medical workup I feel the patient has been appropriately medically screened and is safe for discharge home. Pertinent diagnoses were discussed with the patient. Patient was given return precautions.  Rx / DC Orders ED Discharge Orders     None         Lynita Groseclose, MD 01/09/23 [phone]

## 2023-02-01 ENCOUNTER — Emergency Department (HOSPITAL_BASED_OUTPATIENT_CLINIC_OR_DEPARTMENT_OTHER)
Admission: EM | Admit: 2023-02-01 | Discharge: 2023-02-01 | Disposition: A | Discharge status: Home / Self Care | Payer: Managed Care, Other (non HMO)

## 2023-02-01 ENCOUNTER — Encounter (HOSPITAL_BASED_OUTPATIENT_CLINIC_OR_DEPARTMENT_OTHER): Payer: Self-pay

## 2023-02-01 ENCOUNTER — Emergency Department (HOSPITAL_BASED_OUTPATIENT_CLINIC_OR_DEPARTMENT_OTHER): Payer: Managed Care, Other (non HMO) | Admitting: Radiology

## 2023-02-01 ENCOUNTER — Other Ambulatory Visit: Payer: Self-pay

## 2023-02-01 DIAGNOSIS — S61412A Laceration without foreign body of left hand, initial encounter: Secondary | ICD-10-CM | POA: Diagnosis not present

## 2023-02-01 DIAGNOSIS — W540XXA Bitten by dog, initial encounter: Secondary | ICD-10-CM | POA: Insufficient documentation

## 2023-02-01 DIAGNOSIS — Z23 Encounter for immunization: Secondary | ICD-10-CM | POA: Insufficient documentation

## 2023-02-01 DIAGNOSIS — S60922A Unspecified superficial injury of left hand, initial encounter: Secondary | ICD-10-CM | POA: Diagnosis present

## 2023-02-01 MED ORDER — TETANUS-DIPHTH-ACELL PERTUSSIS 5-2.5-18.5 LF-MCG/0.5 IM SUSY
0.5000 mL | PREFILLED_SYRINGE | Freq: Once | INTRAMUSCULAR | Status: AC
  Administered 2023-02-01: 0.5 mL via INTRAMUSCULAR
  Filled 2023-02-01: qty 0.5

## 2023-02-01 MED ORDER — AMOXICILLIN-POT CLAVULANATE 875-125 MG PO TABS: 1.0000 | ORAL_TABLET | Freq: Two times a day (BID) | ORAL | 0 refills | Status: AC

## 2023-02-01 MED ORDER — LIDOCAINE-EPINEPHRINE (PF) 2 %-1:200000 IJ SOLN
10.0000 mL | Freq: Once | INTRAMUSCULAR | Status: AC
  Administered 2023-02-01: 10 mL
  Filled 2023-02-01: qty 20

## 2023-02-01 MED ORDER — OXYCODONE-ACETAMINOPHEN 5-325 MG PO TABS
1.0000 | ORAL_TABLET | Freq: Once | ORAL | Status: AC
  Administered 2023-02-01: 1 via ORAL
  Filled 2023-02-01: qty 1

## 2023-02-01 NOTE — ED Triage Notes (Signed)
Pt c/o dog bite from known dog, advises dog is UTD on all vaccines "including rabies." Bleeding controlled by home dressing at time of triage, CNS intact distal to injury

## 2023-02-01 NOTE — ED Provider Notes (Signed)
[facility] AT [facility] Provider Note   CSN: [id number] Arrival date & time: 02/01/23  1741     History  Chief Complaint  Patient presents with   Animal Bite    Bennard Zafar is a 47 y.o. male.  47 year old male to the emergency department with laceration to his left hand from dog bite.  His brothers dog.  Dog is up-to-date on vaccinations and in brothers custody.  No abnormal behavior noted.  Patient unsure when his last tetanus shot was.    Animal Bite      Home Medications Prior to Admission medications   Medication Sig Start Date End Date Taking? Authorizing Provider  amoxicillin-clavulanate (AUGMENTIN) 875-125 MG per tablet Take 1 tablet by mouth 2 (two) times daily. 11/30/13   Carmelina Dane, MD  ciprofloxacin (CIPRO) 500 MG tablet Take 1 tablet (500 mg total) by mouth every 12 (twelve) hours. 02/23/19   Elpidio Anis, PA-C  ibuprofen (ADVIL) 800 MG tablet Take 1 tablet (800 mg total) by mouth 3 (three) times daily. 01/09/23   Palumbo, April, MD  metroNIDAZOLE (FLAGYL) 500 MG tablet Take 1 tablet (500 mg total) by mouth 3 (three) times daily. 02/23/19   Elpidio Anis, PA-C  oxyCODONE-acetaminophen (PERCOCET/ROXICET) 5-325 MG tablet Take 1 tablet by mouth every 6 (six) hours as needed for severe pain. 02/23/19   Elpidio Anis, PA-C      Allergies    Patient has no known allergies.    Review of Systems   Review of Systems  Physical Exam Updated Vital Signs BP (!) 141/100 (BP Location: Left Arm)   Pulse 81   Temp 98.8 F (37.1 C)   Resp 18   SpO2 98%  Physical Exam Vitals and nursing note reviewed.  Cardiovascular:     Rate and Rhythm: Normal rate.  Pulmonary:     Effort: Pulmonary effort is normal.  Musculoskeletal:     Comments: Neuro vascular intact in hand.  Good strength normal sensation.  Brisk cap refill.  Skin:    Comments: 2-1/2 cm laceration to the left palm of hand on the pinky aspect.  Venous oozing.  No  foreign bodies.  Also has a small puncture wound to the dorsum of the hand towards the pinky as well.  Neurological:     Mental Status: He is alert.     ED Results / Procedures / Treatments   Labs (all labs ordered are listed, but only abnormal results are displayed) Labs Reviewed - No data to display  EKG None  Radiology DG Hand Complete Right  Result Date: 02/01/2023 CLINICAL DATA:  dog bite EXAM: RIGHT HAND - COMPLETE 3+ VIEW COMPARISON:  None Available. FINDINGS: No acute fracture or dislocation. Degenerative changes of the distal radioulnar joint with a subcortical cyst of the ulnar head. No area of erosion or osseous destruction. No unexpected radiopaque foreign body. Overlying material. Soft tissue edema. IMPRESSION: 1. No acute fracture or dislocation. 2. No unexpected radiopaque foreign body. Electronically Signed   By: Meda Klinefelter M.D.   On: 02/01/2023 18:31    Procedures .Marland KitchenLaceration Repair  Date/Time: 02/01/2023 8:27 PM  Performed by: Coral Spikes, DO Authorized by: Coral Spikes, DO   Consent:    Consent obtained:  Verbal   Consent given by:  Patient   Risks discussed:  Infection, pain and poor cosmetic result   Alternatives discussed:  No treatment Anesthesia:    Anesthesia method:  Local infiltration   Local anesthetic:  Lidocaine 2% WITH epi Laceration details:    Location:  Hand   Hand location:  L palm   Length (cm):  2.5 Pre-procedure details:    Preparation:  Patient was prepped and draped in usual sterile fashion and imaging obtained to evaluate for foreign bodies Exploration:    Hemostasis achieved with:  Epinephrine   Imaging obtained: x-ray     Imaging outcome: foreign body not noted     Wound extent: no foreign body and no tendon damage   Treatment:    Area cleansed with:  Saline   Amount of cleaning:  Standard   Irrigation solution:  Sterile saline Skin repair:    Repair method:  Sutures   Suture size:  4-0   Suture material:   Prolene   Suture technique:  Simple interrupted   Number of sutures:  4 Approximation:    Approximation:  Loose Repair type:    Repair type:  Simple Post-procedure details:    Dressing:  Non-adherent dressing   Procedure completion:  Tolerated     Medications Ordered in ED Medications  Tdap (BOOSTRIX) injection 0.5 mL (has no administration in time range)  lidocaine-EPINEPHrine (XYLOCAINE W/EPI) 2 %-1:200000 (PF) injection 10 mL (10 mLs Infiltration Given 02/01/23 1938)  oxyCODONE-acetaminophen (PERCOCET/ROXICET) 5-325 MG per tablet 1 tablet (1 tablet Oral Given 02/01/23 1939)    ED Course/ Medical Decision Making/ A&P                                 Medical Decision Making 47 year old male present emergency department with dog bite to hand.  Afebrile vital signs reassuring.  Neurovascular intact in hand.  Information obtained for animal control.  Dog is reportedly up-to-date on vaccinations and is in custody of his brother.  Patient was updated on his tetanus.  Given Percocet for pain.  Sutures with 4 sutures.  Discussed return precautions.  Will discharge on Augmentin.  Amount and/or Complexity of Data Reviewed Radiology: ordered.  Risk Prescription drug management.         Final Clinical Impression(s) / ED Diagnoses Final diagnoses:  None    Rx / DC Orders ED Discharge Orders     None         Coral Spikes, DO 02/01/23 2029

## 2023-02-01 NOTE — Discharge Instructions (Addendum)
Please follow-up in 1 week for suture removal here at urgent care.  Please return immediately for fevers, chills, severe pain, redness, drains pus or any new or worsening symptoms that are concerning to you.  As discussed, please keep the wound clean and dry.  Change bandages daily.  Do not submerge in water.  You may take bandages off to shower per normal.  You may use soap, but do not scrub wound.

## 2023-02-08 ENCOUNTER — Other Ambulatory Visit: Payer: Self-pay

## 2023-02-08 ENCOUNTER — Emergency Department (HOSPITAL_BASED_OUTPATIENT_CLINIC_OR_DEPARTMENT_OTHER)
Admission: EM | Admit: 2023-02-08 | Discharge: 2023-02-08 | Disposition: A | Discharge status: Home / Self Care | Payer: Managed Care, Other (non HMO) | Attending: Emergency Medicine | Admitting: Emergency Medicine

## 2023-02-08 ENCOUNTER — Encounter (HOSPITAL_BASED_OUTPATIENT_CLINIC_OR_DEPARTMENT_OTHER): Payer: Self-pay | Admitting: *Deleted

## 2023-02-08 DIAGNOSIS — Z4802 Encounter for removal of sutures: Secondary | ICD-10-CM

## 2023-02-08 DIAGNOSIS — Z4800 Encounter for change or removal of nonsurgical wound dressing: Secondary | ICD-10-CM | POA: Diagnosis present

## 2023-02-08 NOTE — Discharge Instructions (Signed)
Your sutures have been removed today.  Please keep the area clean with soap and water.  You may cover the laceration areas with an antibiotic ointment such as Neosporin or bacitracin as it continues to heal.  Please finish your course of antibiotics.  Return to the ER if you have any area of redness around your laceration sites, any pus drainage, fevers, any other new or concerning symptoms.

## 2023-02-08 NOTE — ED Provider Notes (Signed)
EMERGENCY DEPARTMENT AT [facility] Provider Note   CSN: [id number] Arrival date & time: 02/08/23  1008     History  Chief Complaint  Patient presents with   Suture / Staple Removal    [name] is a 47 y.o. male who presents requesting suture removal.  He had a dog bite that was repaired on 02/01/2023.  He was started on Augmentin.  He denies any redness or pus drainage from the laceration sites.  Denies any other complaints.   Suture / Staple Removal       Home Medications Prior to Admission medications   Medication Sig Start Date End Date Taking? Authorizing Provider  amoxicillin-clavulanate (AUGMENTIN) 875-125 MG tablet Take 1 tablet by mouth every 12 (twelve) hours for 7 days. 02/01/23 02/08/23  Coral Spikes, DO  ciprofloxacin (CIPRO) 500 MG tablet Take 1 tablet (500 mg total) by mouth every 12 (twelve) hours. 02/23/19   Elpidio Anis, PA-C  ibuprofen (ADVIL) 800 MG tablet Take 1 tablet (800 mg total) by mouth 3 (three) times daily. 01/09/23   Palumbo, April, MD  metroNIDAZOLE (FLAGYL) 500 MG tablet Take 1 tablet (500 mg total) by mouth 3 (three) times daily. 02/23/19   Elpidio Anis, PA-C  oxyCODONE-acetaminophen (PERCOCET/ROXICET) 5-325 MG tablet Take 1 tablet by mouth every 6 (six) hours as needed for severe pain. 02/23/19   Elpidio Anis, PA-C      Allergies    Patient has no known allergies.    Review of Systems   Review of Systems  Constitutional:  Negative for fever.    Physical Exam Updated Vital Signs BP (!) 137/96 (BP Location: Right Arm)   Pulse 87   Temp 98.3 F (36.8 C)   Resp 13   SpO2 96%  Physical Exam Musculoskeletal:     Comments: Able to fully flex and extend at the right hand 1st through 5th MCP, PIP, DIPs  Able to fully flex and extend wrist.  Intact sensation in the right hand diffusely  2+ radial pulse bilaterally  Skin:    Comments: Well-healing laceration to the palmar aspect of the right hand,  approximately 3 cm long.  No surrounding erythema or edema, no pus drainage  Well-healing puncture type laceration to the dorsum of the right hand over the second MCP. No surrounding erythema or edema, no pus drainage     ED Results / Procedures / Treatments   Labs (all labs ordered are listed, but only abnormal results are displayed) Labs Reviewed - No data to display  EKG None  Radiology No results found.  Procedures .Suture Removal  Date/Time: 02/08/2023 11:34 AM  Performed by: Arabella Merles, PA-C Authorized by: Arabella Merles, PA-C   Consent:    Consent obtained:  Verbal   Consent given by:  Patient   Risks discussed:  Bleeding   Alternatives discussed:  No treatment Universal protocol:    Procedure explained and questions answered to patient or proxy's satisfaction: yes     Patient identity confirmed:  Verbally with patient Location:    Location:  Upper extremity   Upper extremity location:  Hand   Hand location:  R hand Procedure details:    Wound appearance:  No signs of infection and good wound healing   Number of sutures removed:  4 Post-procedure details:    Post-removal:  No dressing applied   Procedure completion:  Tolerated well, no immediate complications     Medications Ordered in ED Medications - No data to display  ED Course/ Medical Decision Making/ A&P                                 Medical Decision Making    ED Course:  Patient with well-healing laceration to the palm of the right hand.  4 sutures are removed without any complication.  Patient also has well-appearing laceration to the dorsum of his right hand.  Neurovascularly intact.  Full range of motion of the right hand.  Discharged home with instructions to finish the antibiotic he is prescribed.   Impression: Removal  Disposition:  The patient was discharged home with instructions to area clean with soap and water.  Allow the area to finish healing on its own.  Finish  the Augmentin he was prescribed. Return precautions given.  External records from outside source obtained and reviewed including ER note from 02/01/2023 where 4 simple interrupted sutures were placed          Final Clinical Impression(s) / ED Diagnoses Final diagnoses:  Visit for suture removal    Rx / DC Orders ED Discharge Orders     None         Arabella Merles, PA-C 02/08/23 1135    Ernie Avena, MD 02/08/23 1323

## 2023-02-08 NOTE — ED Triage Notes (Signed)
Pt here for suture removal on right hand
